# Patient Record
Sex: Male | Born: 1965
Health system: Southern US, Community
[De-identification: ages and names within clinical notes are randomized; demographics above are authoritative.]

## PROBLEM LIST (undated history)

## (undated) DIAGNOSIS — M549 Dorsalgia, unspecified: Secondary | ICD-10-CM

## (undated) DIAGNOSIS — G8929 Other chronic pain: Secondary | ICD-10-CM

## (undated) DIAGNOSIS — M5030 Other cervical disc degeneration, unspecified cervical region: Secondary | ICD-10-CM

## (undated) DIAGNOSIS — IMO0002 Reserved for concepts with insufficient information to code with codable children: Secondary | ICD-10-CM

## (undated) DIAGNOSIS — I10 Essential (primary) hypertension: Secondary | ICD-10-CM

## (undated) DIAGNOSIS — R42 Dizziness and giddiness: Secondary | ICD-10-CM

## (undated) DIAGNOSIS — I951 Orthostatic hypotension: Secondary | ICD-10-CM

## (undated) DIAGNOSIS — G253 Myoclonus: Secondary | ICD-10-CM

## (undated) DIAGNOSIS — J302 Other seasonal allergic rhinitis: Secondary | ICD-10-CM

## (undated) DIAGNOSIS — R251 Tremor, unspecified: Secondary | ICD-10-CM

## (undated) DIAGNOSIS — R4189 Other symptoms and signs involving cognitive functions and awareness: Secondary | ICD-10-CM

## (undated) DIAGNOSIS — R5383 Other fatigue: Secondary | ICD-10-CM

## (undated) DIAGNOSIS — R634 Abnormal weight loss: Secondary | ICD-10-CM

## (undated) HISTORY — DX: Abnormal weight loss: R63.4

## (undated) HISTORY — DX: Other seasonal allergic rhinitis: J30.2

## (undated) HISTORY — DX: Other fatigue: R53.83

## (undated) HISTORY — DX: Dizziness and giddiness: R42

## (undated) HISTORY — DX: Tremor, unspecified: R25.1

## (undated) HISTORY — DX: Reserved for concepts with insufficient information to code with codable children: IMO0002

---

## 1999-10-31 ENCOUNTER — Other Ambulatory Visit: Admission: RE | Admit: 1999-10-31 | Discharge: 1999-10-31 | Payer: Self-pay | Admitting: Urology

## 2010-09-07 HISTORY — PX: BACK SURGERY: SHX140

## 2011-01-28 ENCOUNTER — Ambulatory Visit (HOSPITAL_COMMUNITY)
Admission: RE | Admit: 2011-01-28 | Discharge: 2011-01-28 | Disposition: A | Discharge status: Home / Self Care | Payer: 59 | Source: Ambulatory Visit | Attending: Neurological Surgery | Admitting: Neurological Surgery

## 2011-01-28 ENCOUNTER — Encounter (HOSPITAL_COMMUNITY)
Admission: RE | Admit: 2011-01-28 | Discharge: 2011-01-28 | Disposition: A | Discharge status: Home / Self Care | Payer: 59 | Source: Ambulatory Visit | Attending: Neurological Surgery | Admitting: Neurological Surgery

## 2011-01-28 ENCOUNTER — Other Ambulatory Visit (HOSPITAL_COMMUNITY): Payer: Self-pay | Admitting: Neurological Surgery

## 2011-01-28 DIAGNOSIS — Z01818 Encounter for other preprocedural examination: Secondary | ICD-10-CM | POA: Insufficient documentation

## 2011-01-28 DIAGNOSIS — M545 Low back pain, unspecified: Secondary | ICD-10-CM | POA: Insufficient documentation

## 2011-01-28 DIAGNOSIS — Z01812 Encounter for preprocedural laboratory examination: Secondary | ICD-10-CM | POA: Insufficient documentation

## 2011-01-28 LAB — DIFFERENTIAL
Basophils Relative: 1 % (ref 0–1)
Eosinophils Absolute: 0.1 10*3/uL (ref 0.0–0.7)
Monocytes Absolute: 0.8 10*3/uL (ref 0.1–1.0)
Monocytes Relative: 10 % (ref 3–12)
Neutrophils Relative %: 69 % (ref 43–77)

## 2011-01-28 LAB — CBC
MCH: 31.5 pg (ref 26.0–34.0)
MCHC: 34.3 g/dL (ref 30.0–36.0)
Platelets: 278 10*3/uL (ref 150–400)
RBC: 4.95 MIL/uL (ref 4.22–5.81)

## 2011-01-28 LAB — BASIC METABOLIC PANEL
BUN: 20 mg/dL (ref 6–23)
CO2: 30 mEq/L (ref 19–32)
Calcium: 9.9 mg/dL (ref 8.4–10.5)
Creatinine, Ser: 1.1 mg/dL (ref 0.4–1.5)
Glucose, Bld: 123 mg/dL — ABNORMAL HIGH (ref 70–99)

## 2011-01-28 LAB — PROTIME-INR: Prothrombin Time: 12.9 seconds (ref 11.6–15.2)

## 2011-02-06 ENCOUNTER — Ambulatory Visit (HOSPITAL_COMMUNITY)
Admission: RE | Admit: 2011-02-06 | Discharge: 2011-02-06 | Disposition: A | Discharge status: Home / Self Care | Payer: 59 | Source: Ambulatory Visit | Attending: Neurological Surgery | Admitting: Neurological Surgery

## 2011-02-06 DIAGNOSIS — M5126 Other intervertebral disc displacement, lumbar region: Secondary | ICD-10-CM | POA: Insufficient documentation

## 2011-02-06 DIAGNOSIS — M79609 Pain in unspecified limb: Secondary | ICD-10-CM | POA: Insufficient documentation

## 2011-02-06 DIAGNOSIS — Z5309 Procedure and treatment not carried out because of other contraindication: Secondary | ICD-10-CM | POA: Insufficient documentation

## 2011-02-27 ENCOUNTER — Ambulatory Visit (HOSPITAL_COMMUNITY): Admission: RE | Admit: 2011-02-27 | Payer: 59 | Source: Ambulatory Visit | Admitting: Neurological Surgery

## 2011-03-04 ENCOUNTER — Encounter (HOSPITAL_COMMUNITY)
Admission: RE | Admit: 2011-03-04 | Discharge: 2011-03-04 | Disposition: A | Discharge status: Home / Self Care | Payer: 59 | Source: Ambulatory Visit | Attending: Neurological Surgery | Admitting: Neurological Surgery

## 2011-03-04 LAB — SURGICAL PCR SCREEN: MRSA, PCR: NEGATIVE

## 2011-03-04 LAB — CBC
MCH: 31.5 pg (ref 26.0–34.0)
MCV: 91.3 fL (ref 78.0–100.0)
Platelets: 276 10*3/uL (ref 150–400)
RBC: 4.85 MIL/uL (ref 4.22–5.81)
RDW: 13 % (ref 11.5–15.5)

## 2011-03-04 LAB — BASIC METABOLIC PANEL
CO2: 27 mEq/L (ref 19–32)
Calcium: 9.5 mg/dL (ref 8.4–10.5)
Glucose, Bld: 95 mg/dL (ref 70–99)
Sodium: 138 mEq/L (ref 135–145)

## 2011-03-04 LAB — DIFFERENTIAL
Eosinophils Absolute: 0 10*3/uL (ref 0.0–0.7)
Eosinophils Relative: 1 % (ref 0–5)
Lymphs Abs: 1.8 10*3/uL (ref 0.7–4.0)
Monocytes Relative: 8 % (ref 3–12)

## 2011-03-09 ENCOUNTER — Inpatient Hospital Stay (HOSPITAL_COMMUNITY)
Admission: RE | Admit: 2011-03-09 | Discharge: 2011-03-11 | DRG: 460 | Disposition: A | Discharge status: Home / Self Care | Payer: 59 | Source: Ambulatory Visit | Attending: Neurological Surgery | Admitting: Neurological Surgery

## 2011-03-09 ENCOUNTER — Inpatient Hospital Stay (HOSPITAL_COMMUNITY): Payer: 59

## 2011-03-09 DIAGNOSIS — M5126 Other intervertebral disc displacement, lumbar region: Principal | ICD-10-CM | POA: Diagnosis present

## 2011-03-09 DIAGNOSIS — Z01812 Encounter for preprocedural laboratory examination: Secondary | ICD-10-CM

## 2011-03-13 ENCOUNTER — Ambulatory Visit
Admission: RE | Admit: 2011-03-13 | Discharge: 2011-03-13 | Disposition: A | Discharge status: Home / Self Care | Payer: 59 | Source: Ambulatory Visit | Attending: Neurological Surgery | Admitting: Neurological Surgery

## 2011-03-13 ENCOUNTER — Other Ambulatory Visit: Payer: Self-pay | Admitting: Neurological Surgery

## 2011-03-13 DIAGNOSIS — M5126 Other intervertebral disc displacement, lumbar region: Secondary | ICD-10-CM

## 2011-03-13 DIAGNOSIS — R509 Fever, unspecified: Secondary | ICD-10-CM

## 2011-04-03 NOTE — Op Note (Signed)
NAMECHAO, BLAZEJEWSKI               ACCOUNT NO.:  [id number]  MEDICAL RECORD NO.:  [id number]  LOCATION:  3528                         FACILITY:  [facility]  PHYSICIAN:  Tia Alert, MD     DATE OF BIRTH:  May 18, 1966  DATE OF PROCEDURE:  03/09/2011 DATE OF DISCHARGE:                              OPERATIVE REPORT   PREOPERATIVE DIAGNOSIS:  Lumbar disk herniation, L5-S1, with severe spinal stenosis, back and leg pain.  POSTOPERATIVE DIAGNOSIS:  Lumbar disk herniation, L5-S1, with severe spinal stenosis, back and leg pain.  PROCEDURE: 1. Decompressive lumbar laminectomy, hemifacetectomy and     foraminotomies at L5-S1 to decompress the L5 and S1 nerve roots,     requiring more work to decompress the nerves then is required of a     simple plus procedure. 2. Posterior lumbar interbody fusion, L5-S1, utilizing 10-mm tangent     interbody bone wedge and a 10-mm PEEK interbody cage, packed with     local autograft and Actifuse putty. 3. Intertransverse arthrodesis of L5-S1 utilizing local autograft and     Actifuse putty. 4. Nonsegmental fixation of L5-S1 utilizing DePuy spine Expedium     pedicle screw system.  SURGEON:  Tia Alert, MD.  ASSISTANT:  Reinaldo Meeker, MD.  ANESTHESIA:  General endotracheal.  COMPLICATIONS:  None apparent.  INDICATIONS FOR PROCEDURE:  Dustin Rogers is a 45 year old gentleman who presented with back and leg pain.  He had an MRI which showed a transitional level at L5-S1 with a large disk herniation with severe canal stenosis from a very large free fragment, recommended a posterior lumbar interbody fusion at L5-S1.  He understood the risks, benefits, and expected outcome and wished to proceed.  DESCRIPTION OF PROCEDURE:  The patient was taken to the operating room. After induction of adequate generalized endotracheal anesthesia, he was rolled into prone position on chest rolls and all pressure points were padded.  His lumbar region was prepped  with DuraPrep and then draped in the usual sterile fashion.  A 10 mL of local anesthesia was injected and a dorsal midline incision made and carried down to the lumbosacral fascia.  The fascia was opened and the paraspinous musculature was taken down in a subperiosteal fashion to expose L5-S1.  Intraoperative fluoroscopy confirmed my level and then used the Leksell rongeur to remove the spinous process.  I used the Kerrison punch to perform a hemilaminectomy, hemifacetectomy, foraminotomy at L5-S1.  The L5-S1 nerve roots were identified and decompressed distally into their respective foramina.  The dura was very taut and tense because of the underlying large free fragment.  I saw it peaking out from under the S1 nerve root on the right.  I was able to remove a large fragment.  I was unable to retract the dura medially and found two more very large free fragments that were removed.  Once this was done, the nerve roots relaxed and were less tense.  The dura was less tense.  I was then able to finish my decompression of L5-S1 nerve roots distally into their foramina.  I was then able to incise the disk space bilaterally and performed the diskectomy with pituitary rongeurs.  We then used the tangent interbody instruments with rotating cutters and cutting chisels to prepare the endplates for arthrodesis.  We used 10-mm tangent interbody bone wedge on the patient's right and a 10-mm PEEK interbody cage on the patient's left.  The midline was packed with local autograft and Actifuse putty.  Once our PLIF was complete, we turned our attention of the nonsegmental fixation.  We localized the pedicle screw entry zones at L5-S1 utilizing surface landmarks and lateral and AP fluoroscopy.  We probed each pedicle with pedicle probe, tapped each pedicle with 5.0 tap, placed 6.0 x 45-mm pedicle screws at each pedicle. I decorticated the transverse processes and placed a mixture of local autograft and  Actifuse putty out over these to perform intertransverse arthrodesis.  I then placed lordotic rods into multiaxial screw heads of the pedicle screws and locked these into position with a locking caps and anti-torque device while achieving compression on the grafts.  Then irrigated with saline solution containing bacitracin.  I dried all bleeding points with bipolar cautery, aligned the dura with Gelfoam after palpating along the nerve roots with coronary dilator to assure adequate decompression of the nerve roots.  I checked my final construct with AP and lateral fluoroscopy.  I placed a medium Hemovac drain through a separate stab incision.  I then closed the muscle and fascia with 0 Vicryl, closed the subcutaneous and subcuticular tissue with 2-0 and 3-0 Vicryl, closed the skin with Benzoin and Steri-Strips.  The drapes were removed.  Sterile dressing was applied.  The patient awakened from general anesthesia and transferred to recovery room in stable condition.  At the end of the procedure, all sponge, needle, and instrument counts were correct.     Tia Alert, MD     DSJ/MEDQ  D:  03/09/2011  T:  03/10/2011  Job:  [id number]  Electronically Signed by [name] MD on 04/03/2011 11:35:50 AM

## 2011-04-07 NOTE — Discharge Summary (Signed)
  NAMEPACE, LAMADRID               ACCOUNT NO.:  [id number]  MEDICAL RECORD NO.:  [id number]  LOCATION:  3528                         FACILITY:  [facility]  PHYSICIAN:  Tia Alert, MD     DATE OF BIRTH:  1966-02-20  DATE OF ADMISSION:  03/09/2011 DATE OF DISCHARGE:  03/11/2011                              DISCHARGE SUMMARY   ADMISSION DIAGNOSIS:  Lumbar disk herniation with severe spinal stenosis.  PROCEDURES:  Posterior lumbar interbody fusion.  BRIEF HISTORY OF PRESENT ILLNESS:  [name] is a very pleasant 45 year old gentleman who presented with severe back and leg pain.  He had an MRI which showed a transitional segment of L5-S1 with large disk herniation with severe canal stenosis with large free fragment.  I recommended posterior lumbar interbody fusion at L5-S1.  He understood the risks, benefits, and expected outcome and wished to proceed.  HOSPITAL COURSE:  The patient was admitted on March 09, 2011, and taken to the operating room where underwent a posterior lumbar interbody fusion at L5-S1.  The patient tolerated the procedure well and was taken to the recovery room and then to the floor in stable condition for details of the operative procedure.  Please see the dictated operative note.  The patient's hospital course was routine.  There were complications.  He did very well following this surgery.  He had resolution of his leg pain.  He had a little residual numbness in his leg.  His back pain was fairly well controlled.  He remained on a PCA the first evening.  This was removed the next day.  He was ambulating without difficulty.  He remained afebrile with stable vital signs.  He was tolerating a regular diet.  He was discharged to home in stable condition on March 11, 2011, with plans to follow up in 2 weeks.  FINAL DIAGNOSIS:  Posterior lumbar interbody fusion at L5-S1.     Tia Alert, MD     DSJ/MEDQ  D:  04/06/2011  T:  04/06/2011  Job:   [id number]  Electronically Signed by [name] MD on 04/07/2011 03:17:59 PM

## 2011-04-20 ENCOUNTER — Other Ambulatory Visit: Payer: Self-pay | Admitting: Neurological Surgery

## 2011-04-20 ENCOUNTER — Ambulatory Visit
Admission: RE | Admit: 2011-04-20 | Discharge: 2011-04-20 | Disposition: A | Discharge status: Home / Self Care | Payer: 59 | Source: Ambulatory Visit | Attending: Neurological Surgery | Admitting: Neurological Surgery

## 2011-04-20 DIAGNOSIS — M545 Low back pain: Secondary | ICD-10-CM

## 2011-04-20 DIAGNOSIS — M79609 Pain in unspecified limb: Secondary | ICD-10-CM

## 2011-05-18 ENCOUNTER — Other Ambulatory Visit: Payer: Self-pay | Admitting: Occupational Medicine

## 2011-05-18 ENCOUNTER — Ambulatory Visit: Payer: Self-pay

## 2011-05-18 DIAGNOSIS — Z021 Encounter for pre-employment examination: Secondary | ICD-10-CM

## 2011-07-20 ENCOUNTER — Ambulatory Visit
Admission: RE | Admit: 2011-07-20 | Discharge: 2011-07-20 | Disposition: A | Discharge status: Home / Self Care | Payer: BC Managed Care – PPO | Source: Ambulatory Visit | Attending: Neurological Surgery | Admitting: Neurological Surgery

## 2011-07-20 ENCOUNTER — Other Ambulatory Visit: Payer: Self-pay | Admitting: Neurological Surgery

## 2011-07-20 DIAGNOSIS — M5126 Other intervertebral disc displacement, lumbar region: Secondary | ICD-10-CM

## 2011-07-20 DIAGNOSIS — M545 Low back pain: Secondary | ICD-10-CM

## 2011-10-27 ENCOUNTER — Other Ambulatory Visit: Payer: Self-pay | Admitting: Neurological Surgery

## 2011-10-27 ENCOUNTER — Ambulatory Visit
Admission: RE | Admit: 2011-10-27 | Discharge: 2011-10-27 | Disposition: A | Discharge status: Home / Self Care | Payer: BC Managed Care – PPO | Source: Ambulatory Visit | Attending: Neurological Surgery | Admitting: Neurological Surgery

## 2011-10-27 DIAGNOSIS — M5126 Other intervertebral disc displacement, lumbar region: Secondary | ICD-10-CM

## 2011-10-27 DIAGNOSIS — M545 Low back pain: Secondary | ICD-10-CM

## 2012-07-17 IMAGING — RF DG LUMBAR SPINE 2-3V
1 series · 2 of 2 positions shown · non-contrast
Comparison: None

CLINICAL DATA: L4-5 PLIF

LUMBAR SPINE - 2-3 VIEW

[Series 1: run · 2 of 2 slices shown]
[im 1/2]
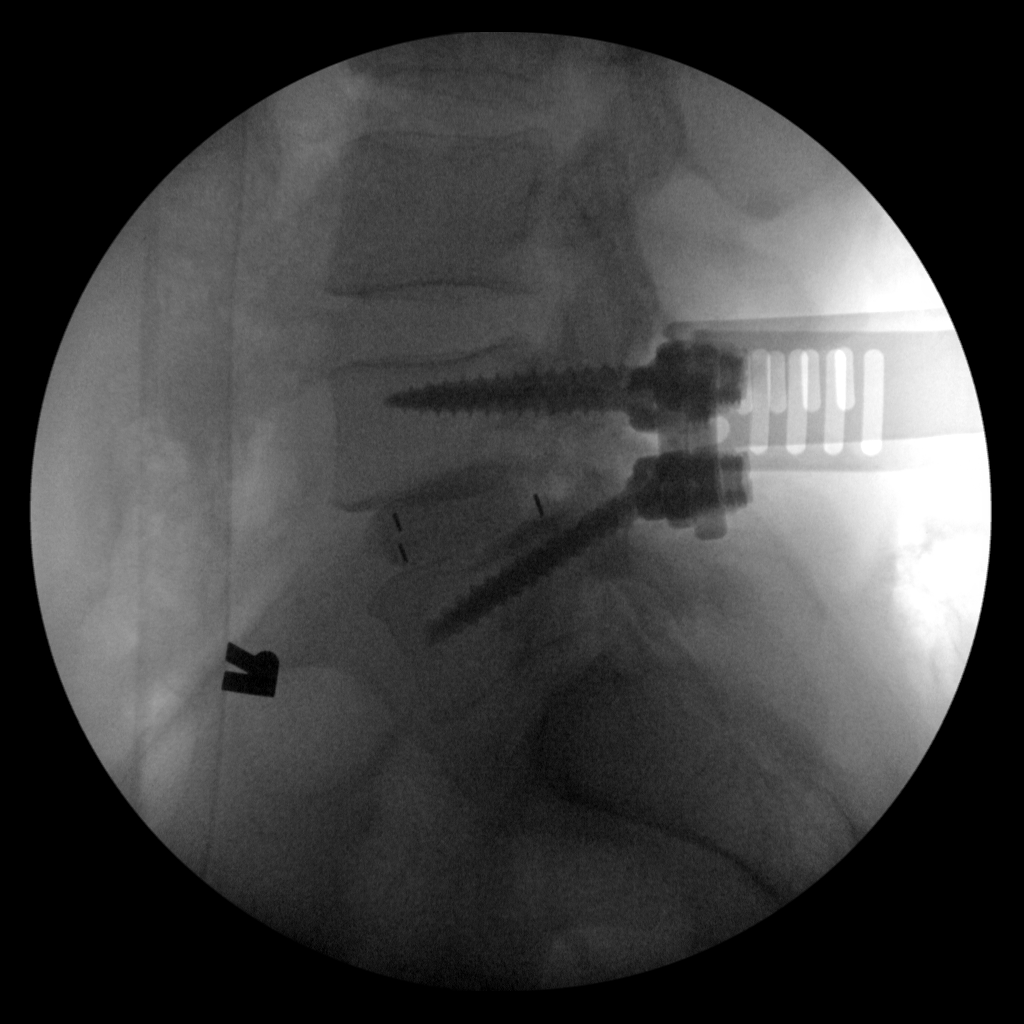
[im 2/2]
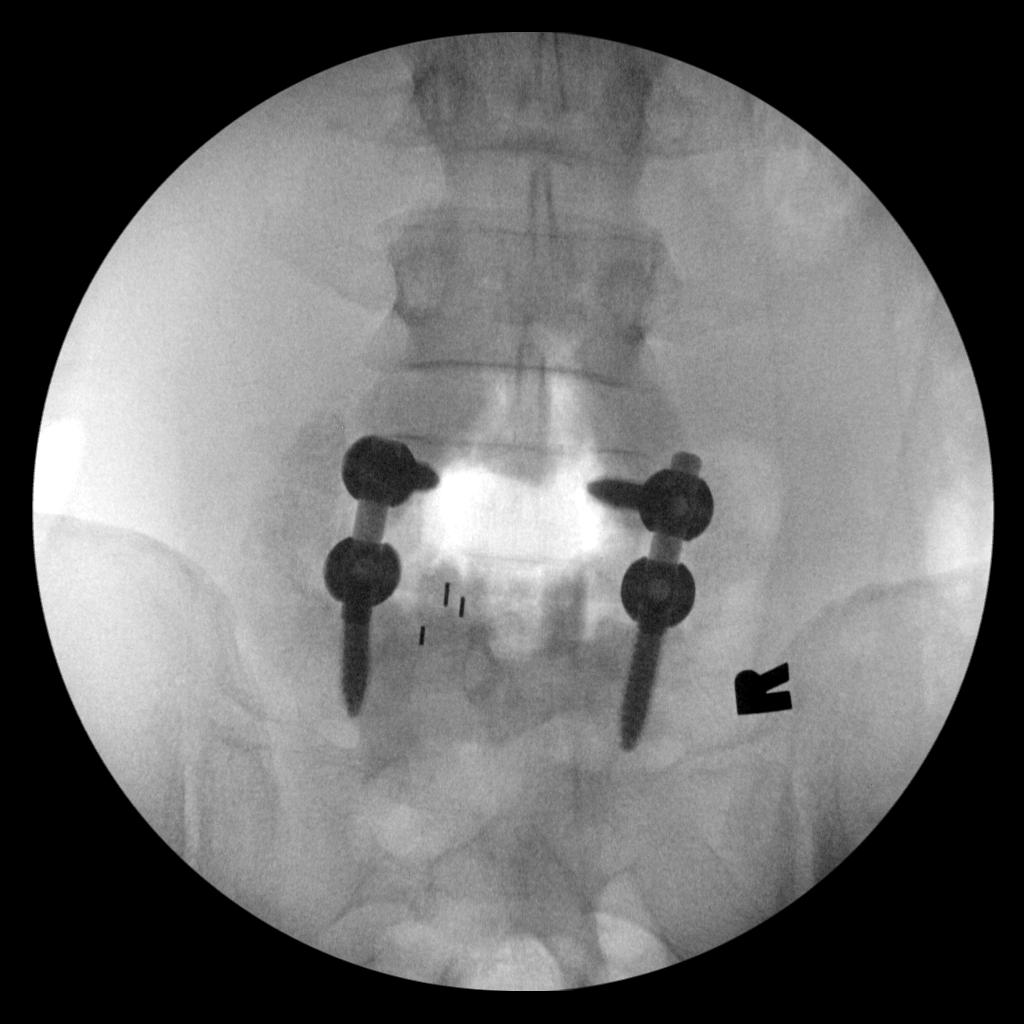

[2 of 2 positions shown; findings below may reference images not displayed]

FINDINGS: Two C-arm images show posterior decompression at L4-5
with discectomy and interbody fusion material, bilateral pedicle
screws and posterior rods.  Components appear well positioned.  No
radiographically detectable complication.
IMPRESSION: Decompression and fusion L4-5.

## 2012-11-27 IMAGING — CR DG LUMBAR SPINE 2-3V
3 series · 3 of 3 positions shown · non-contrast
Comparison: 04/20/2011.

CLINICAL DATA: Bilateral foot numbness.  Lumbar surgery.

LUMBAR SPINE - 2-3 VIEW

[t l-spine a.p.]
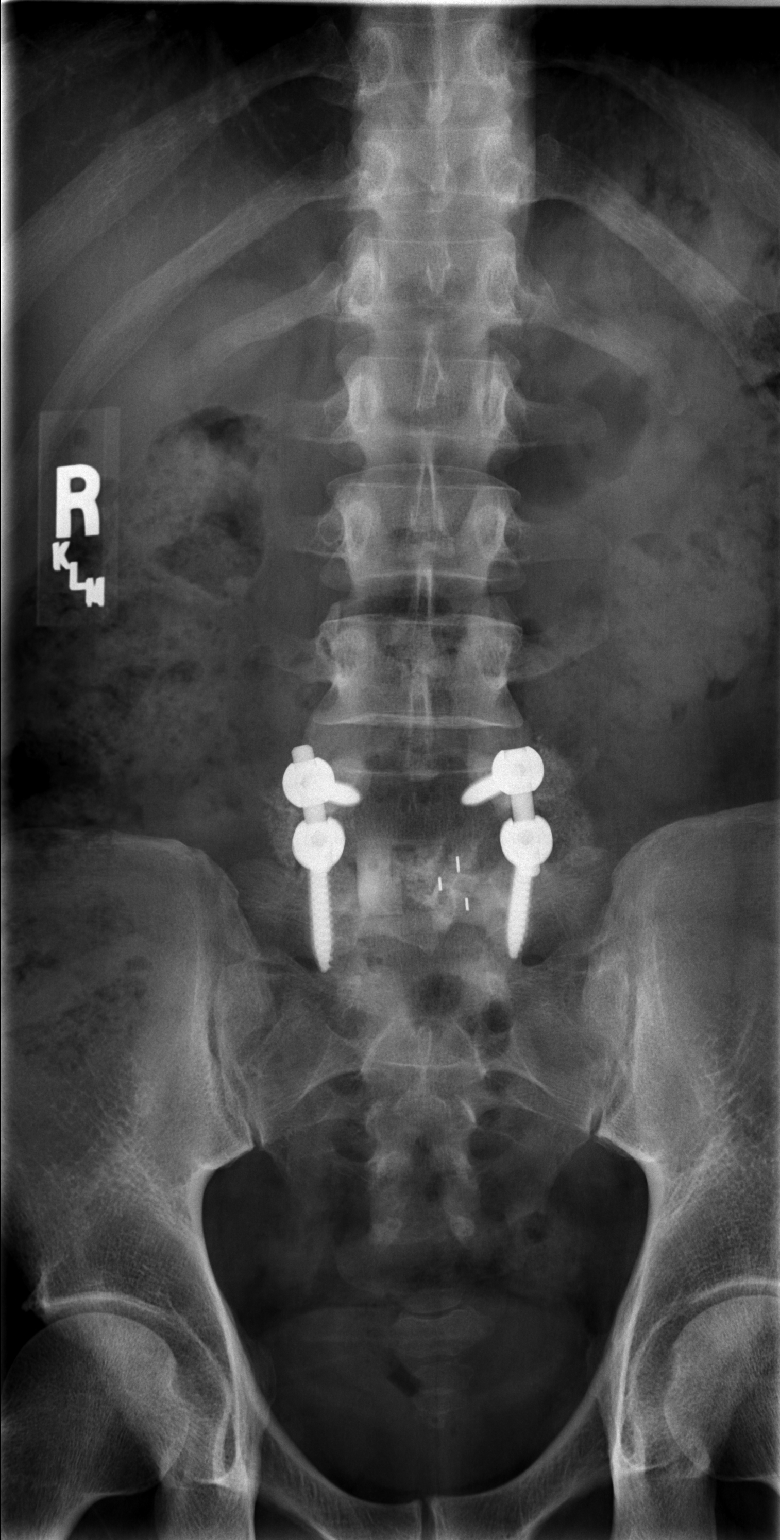

[t l-spine lat]
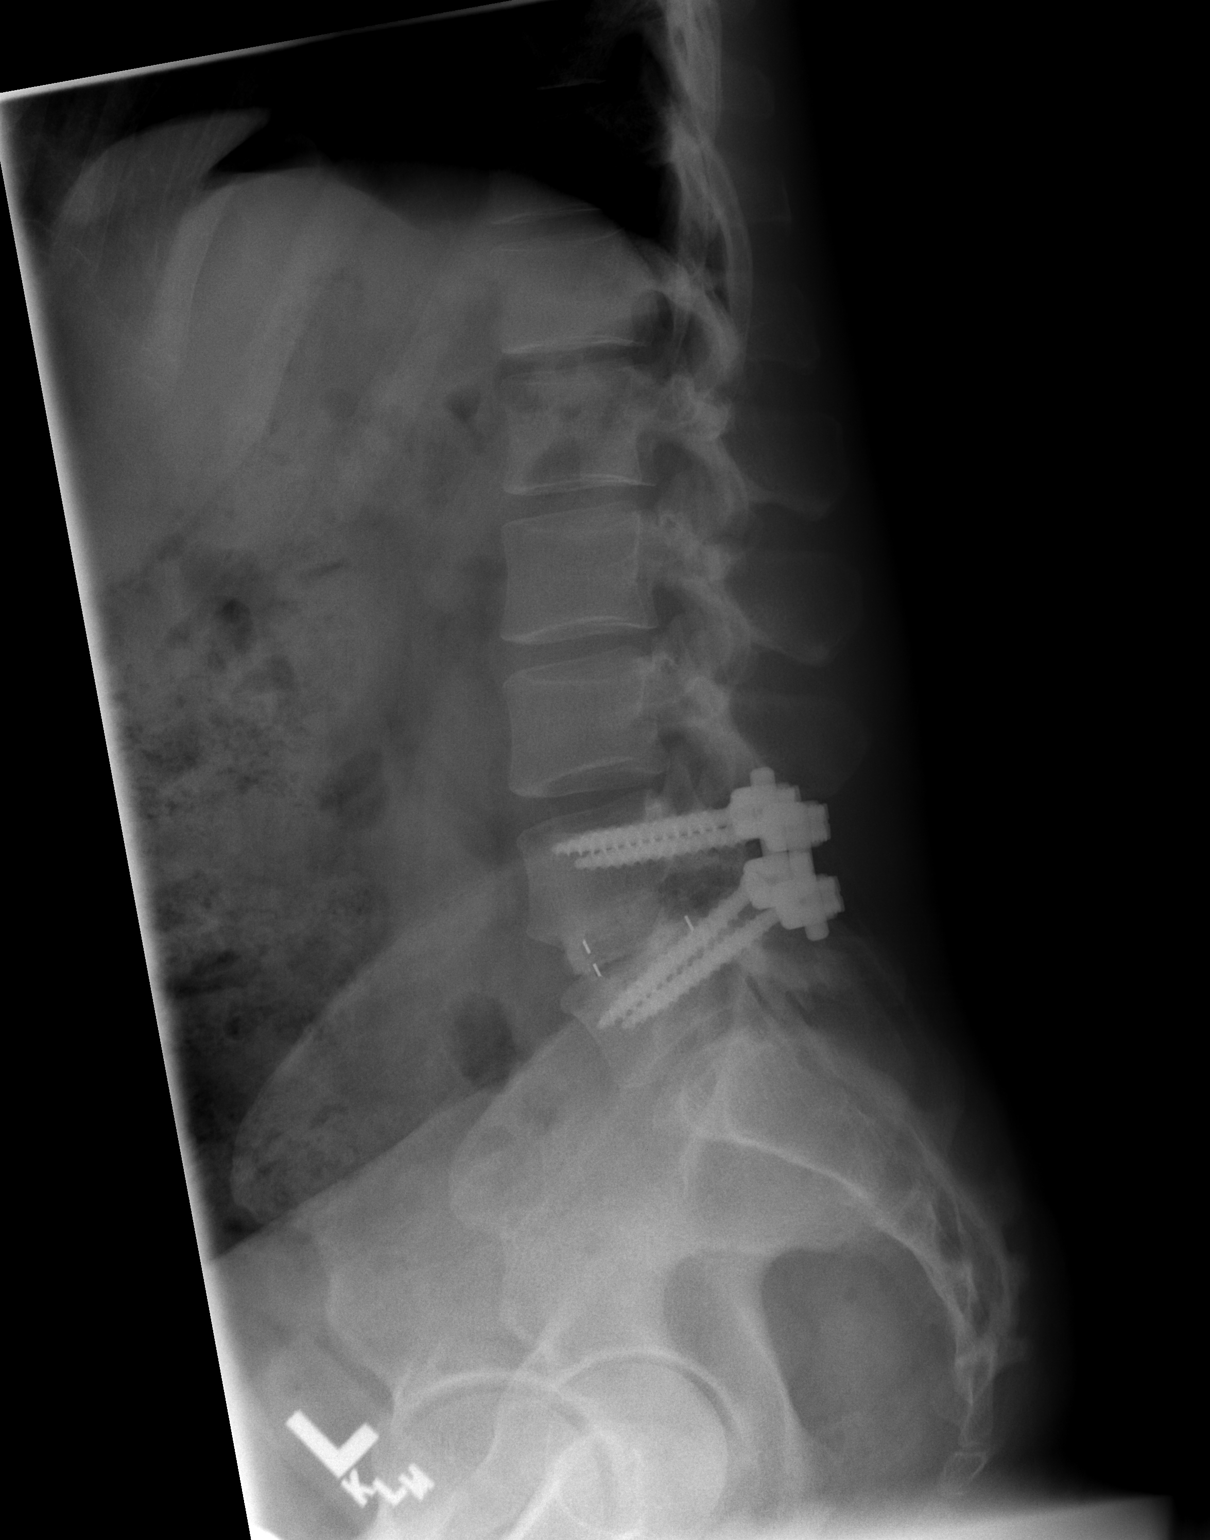

[t l-spine l5-s1 spot]
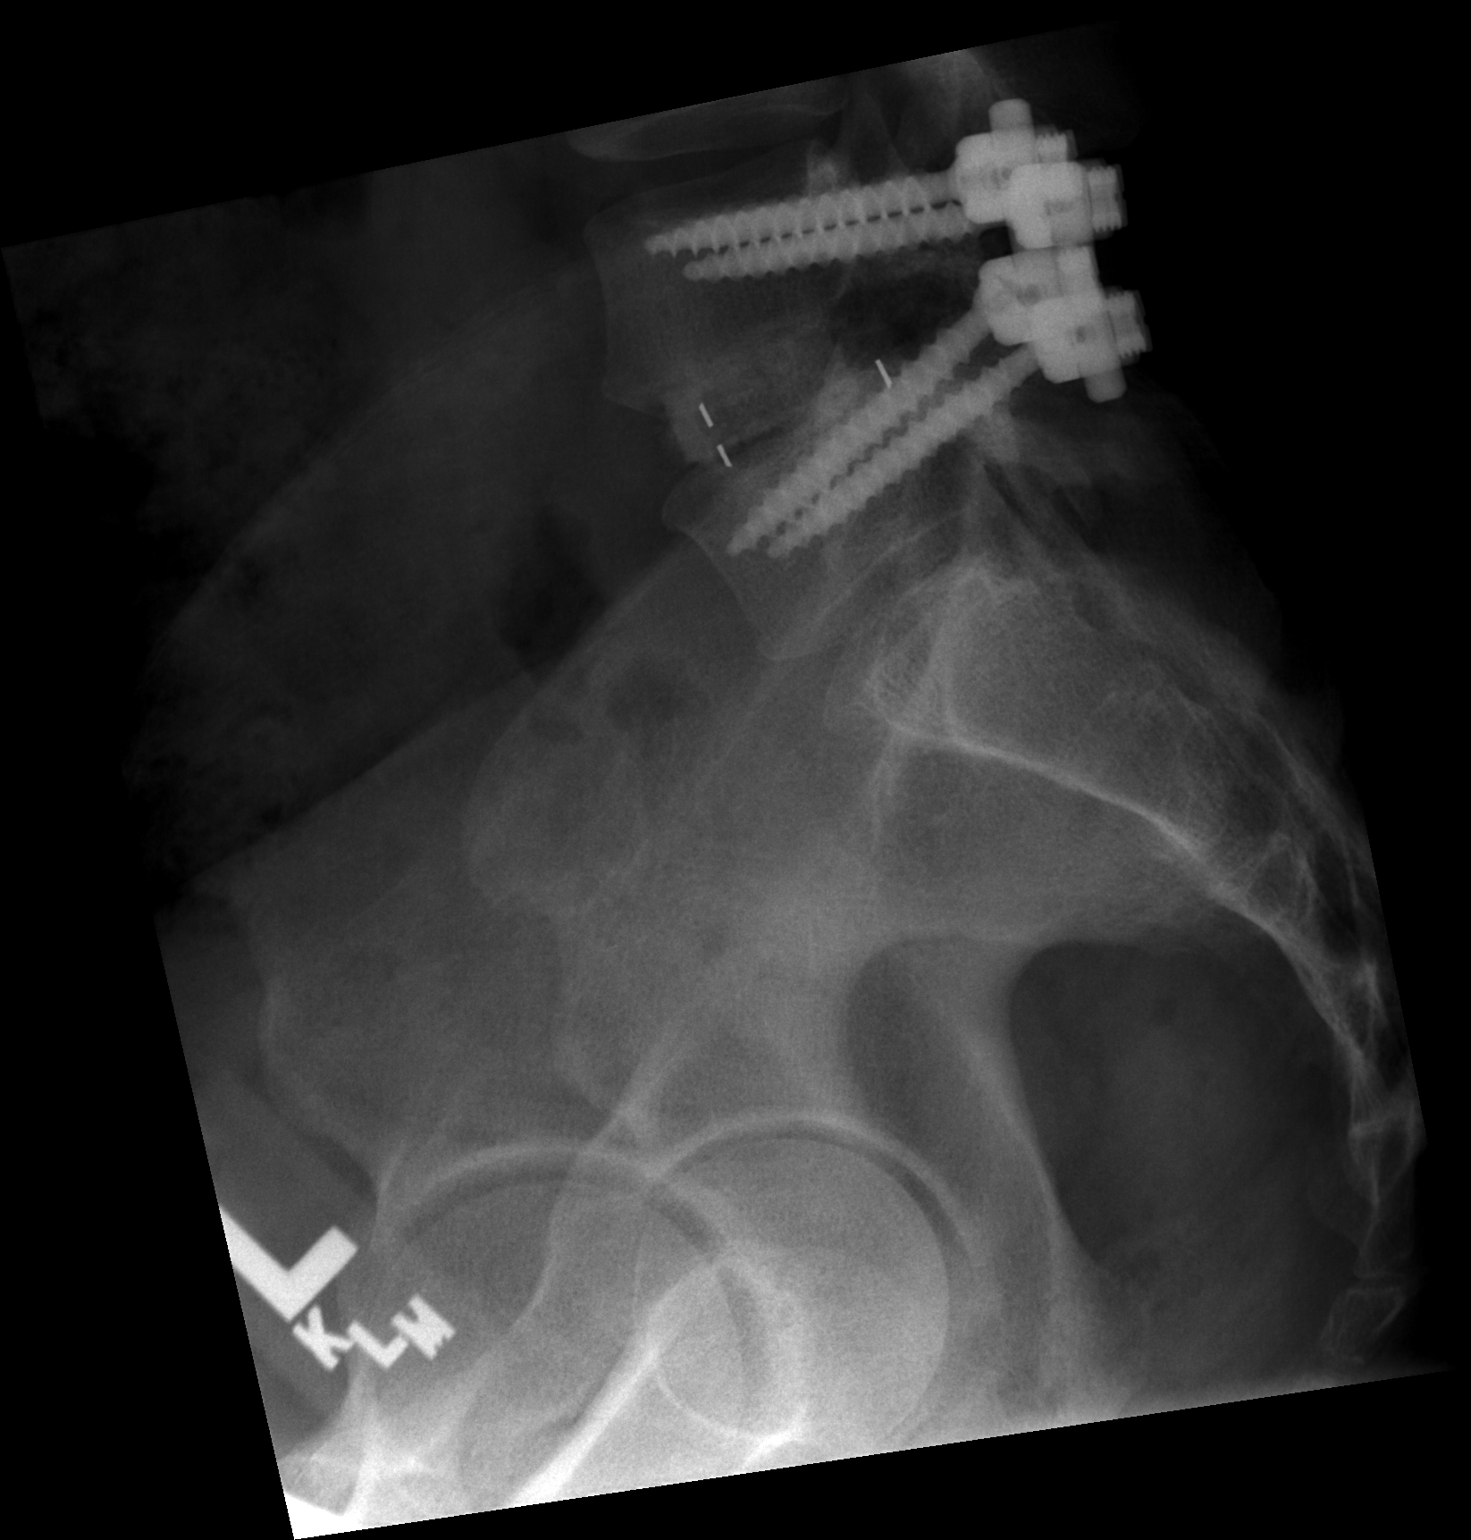

[3 of 3 positions shown; findings below may reference images not displayed]

FINDINGS: The patient is status post L4-5 posterior lumbar
interbody fusion.  Appearance is unchanged from 04/20/2011.  No
hardware complications.  Alignment is anatomic.  Vertebral body
height is maintained.  No significant degenerative changes above or
below the level of the fusion.
IMPRESSION: L4-5 PLIF without complicating feature.

## 2013-05-03 ENCOUNTER — Emergency Department (HOSPITAL_COMMUNITY): Payer: BC Managed Care – PPO

## 2013-05-03 ENCOUNTER — Emergency Department (HOSPITAL_COMMUNITY)
Admission: EM | Admit: 2013-05-03 | Discharge: 2013-05-03 | Disposition: A | Discharge status: Home / Self Care | Payer: BC Managed Care – PPO | Attending: Emergency Medicine | Admitting: Emergency Medicine

## 2013-05-03 ENCOUNTER — Encounter (HOSPITAL_COMMUNITY): Payer: Self-pay | Admitting: *Deleted

## 2013-05-03 DIAGNOSIS — R002 Palpitations: Secondary | ICD-10-CM | POA: Insufficient documentation

## 2013-05-03 DIAGNOSIS — I10 Essential (primary) hypertension: Secondary | ICD-10-CM | POA: Insufficient documentation

## 2013-05-03 DIAGNOSIS — Z79899 Other long term (current) drug therapy: Secondary | ICD-10-CM | POA: Insufficient documentation

## 2013-05-03 HISTORY — DX: Essential (primary) hypertension: I10

## 2013-05-03 LAB — CBC
HCT: 40.9 % (ref 39.0–52.0)
MCV: 87.2 fL (ref 78.0–100.0)
RBC: 4.69 MIL/uL (ref 4.22–5.81)
WBC: 8.3 10*3/uL (ref 4.0–10.5)

## 2013-05-03 LAB — D-DIMER, QUANTITATIVE: D-Dimer, Quant: <0.27 ug/mL-FEU (ref 0.00–0.48)

## 2013-05-03 LAB — BASIC METABOLIC PANEL
BUN: 14 mg/dL (ref 6–23)
CO2: 24 mEq/L (ref 19–32)
Chloride: 103 mEq/L (ref 96–112)
Creatinine, Ser: 1.07 mg/dL (ref 0.50–1.35)

## 2013-05-03 NOTE — ED Notes (Signed)
Spoke with [name], in laboratory she reports she has a blue top and will add on the D-dimer.

## 2013-05-03 NOTE — ED Provider Notes (Signed)
Medical screening examination/treatment/procedure(s) were performed by non-physician practitioner and as supervising physician I was immediately available for consultation/collaboration.   Junius Argyle, MD 05/03/13 1950

## 2013-05-03 NOTE — ED Provider Notes (Signed)
CSN: [id number]     Arrival date & time 05/03/13  1228 History   First MD Initiated Contact with Patient 05/03/13 1249     Chief Complaint  Patient presents with  . Shortness of Breath  . Palpitations   (Consider location/radiation/quality/duration/timing/severity/associated sxs/prior Treatment) HPI Comments: Patient is a 47 y/o male with a hx of HTN who presents for intermittent palpitations x 1 week. Patient states that palpitations occur with exertion or at rest and he describes them as his "heart pounding". He denies a rapid heart rate with symptoms. Patient denies any modifying factors of his symptoms. He admits to associated shortness of breath with palpitations and denies associated fever, jaw pain, diaphoresis, numbness/tingling, extremity weakness, N/V, abdominal pain, lightheadedness/dizziness, and syncope. Patient denies hx of ACS, CAD, HLD, DM and PE/DVT. Denies recent long plane rides/car rides, HRT, and recent surgeries or immunizations. Denies FHx of ACS, CAD, and stroke. Patient is a never smoker.  Patient is a 47 y.o. male presenting with shortness of breath and palpitations. The history is provided by the patient. No language interpreter was used.  Shortness of Breath Associated symptoms: no chest pain, no diaphoresis, no fever and no vomiting   Palpitations Associated symptoms: shortness of breath   Associated symptoms: no chest pain, no diaphoresis, no nausea, no numbness and no vomiting     Past Medical History  Diagnosis Date  . Hypertension    History reviewed. No pertinent past surgical history. History reviewed. No pertinent family history. History  Substance Use Topics  . Smoking status: Never Smoker   . Smokeless tobacco: Not on file  . Alcohol Use: Not on file    Review of Systems  Constitutional: Negative for fever and diaphoresis.  Eyes: Negative for visual disturbance.  Respiratory: Positive for shortness of breath.   Cardiovascular: Positive for  palpitations. Negative for chest pain.  Gastrointestinal: Negative for nausea and vomiting.  Neurological: Negative for weakness and numbness.  All other systems reviewed and are negative.   Allergies  Review of patient's allergies indicates no known allergies.  Home Medications   Current Outpatient Rx  Name  Route  Sig  Dispense  Refill  . olmesartan (BENICAR) 20 MG tablet   Oral   Take 20 mg by mouth daily.         . tadalafil (CIALIS) 20 MG tablet   Oral   Take 20 mg by mouth daily as needed for erectile dysfunction.         . Vitamin D, Ergocalciferol, (DRISDOL) 50000 UNITS CAPS capsule   Oral   Take 50,000 Units by mouth every Monday.          BP 133/78  Pulse 57  Temp(Src) 98.2 F (36.8 C) (Oral)  Resp 14  Ht 5\' 9"  (1.753 m)  Wt 167 lb (75.751 kg)  BMI 24.65 kg/m2  SpO2 99%  Physical Exam  Nursing note and vitals reviewed. Constitutional: He is oriented to person, place, and time. He appears well-developed and well-nourished. No distress.  HENT:  Head: Normocephalic and atraumatic.  Mouth/Throat: Oropharynx is clear and moist. No oropharyngeal exudate.  Eyes: Conjunctivae and EOM are normal. Pupils are equal, round, and reactive to light. No scleral icterus.  Neck: Normal range of motion. Neck supple.  Cardiovascular: Normal rate, regular rhythm, normal heart sounds and intact distal pulses.   Pulmonary/Chest: Effort normal and breath sounds normal. No respiratory distress. He has no wheezes. He has no rales. He exhibits no tenderness.  Abdominal: Soft.  He exhibits no distension. There is no tenderness.  Musculoskeletal: Normal range of motion.  Neurological: He is alert and oriented to person, place, and time.  Skin: Skin is warm and dry. No rash noted. He is not diaphoretic. No erythema. No pallor.  Psychiatric: He has a normal mood and affect. His behavior is normal.    ED Course  Procedures (including critical care time) Labs Review Labs  Reviewed  BASIC METABOLIC PANEL - Abnormal; Notable for the following:    GFR calc non Af Amer 82 (*)    All other components within normal limits  CBC  PRO B NATRIURETIC PEPTIDE  D-DIMER, QUANTITATIVE  POCT I-STAT TROPONIN I   Imaging Review Dg Chest 2 View  05/03/2013   *RADIOLOGY REPORT*  Clinical Data:  Shortness of breath and hypertension.  CHEST - 2 VIEW  Comparison: 05/18/2011  Findings: The heart size and mediastinal contours are within normal limits.  Both lungs are clear.  The visualized skeletal structures are unremarkable.  IMPRESSION: No active disease.   Original Report Authenticated By: Irish Lack, M.D.    Date: 05/03/2013  Rate: 70  Rhythm: normal sinus rhythm  QRS Axis: normal  Intervals: normal  ST/T Wave abnormalities: normal  Conduction Disutrbances:none  Narrative Interpretation: NSR; no STEMI or ischemic changes  Old EKG Reviewed: unchanged from 01/28/2011 I have personally reviewed and interpreted this EKG  MDM   1. Palpitations    47 y/o male with hx of HTN who presents for palpitations. Patient asymptomatic throughout ED course. He is well and nontoxic appearing, hemodynamically stable, and afebrile. Physical exam findings unremarkable. EKG unchanged from prior, without STEMI or ischemic changes. Chest x-ray negative for pneumonia, pneumothorax, pleural effusion, or other acute cardiopulmonary abnormality. Labs today unremarkable. Troponin 0.00, BNP wnl, and D dimer not elevated effectively ruling out pulmonary embolism. Patient endorses increased stress and anxiety lately. Believes that increased stress is responsible for patient's symptoms. Doubt ACS given atypical nature of symptoms, lack of family history, physical exam, and reassuring workup today. Patient's TIMI score 0. Patient appropriate for discharge with primary care followup. Have also referred patient to cardiology to have outpatient stress test completed. Indications for ED return provided and  patient agreeable to plan with no unaddressed concerns.    Antony Madura, PA-C 05/03/13 1526

## 2013-05-03 NOTE — ED Notes (Signed)
Pt in c/o shortness of breath and palpitations since last night, denies pain, states symptoms are constant, also c/o dizziness, denies history of same

## 2013-05-20 ENCOUNTER — Encounter: Payer: Self-pay | Admitting: Neurology

## 2013-05-20 DIAGNOSIS — J302 Other seasonal allergic rhinitis: Secondary | ICD-10-CM | POA: Insufficient documentation

## 2013-05-20 DIAGNOSIS — R42 Dizziness and giddiness: Secondary | ICD-10-CM | POA: Insufficient documentation

## 2013-05-20 DIAGNOSIS — R5383 Other fatigue: Secondary | ICD-10-CM

## 2013-05-20 DIAGNOSIS — I10 Essential (primary) hypertension: Secondary | ICD-10-CM

## 2013-05-23 ENCOUNTER — Ambulatory Visit (INDEPENDENT_AMBULATORY_CARE_PROVIDER_SITE_OTHER): Payer: BC Managed Care – PPO | Admitting: Neurology

## 2013-05-23 ENCOUNTER — Encounter: Payer: Self-pay | Admitting: Neurology

## 2013-05-23 VITALS — BP 109/75 | HR 65 | Ht 70.0 in | Wt 166.0 lb

## 2013-05-23 DIAGNOSIS — R259 Unspecified abnormal involuntary movements: Secondary | ICD-10-CM

## 2013-05-23 DIAGNOSIS — R251 Tremor, unspecified: Secondary | ICD-10-CM | POA: Insufficient documentation

## 2013-05-23 DIAGNOSIS — G609 Hereditary and idiopathic neuropathy, unspecified: Secondary | ICD-10-CM

## 2013-05-23 NOTE — Patient Instructions (Addendum)
Overall you are doing fairly well but I do want to suggest a few things today:   Remember to drink plenty of fluid, eat healthy meals and do not skip any meals. Try to eat protein with a every meal and eat a healthy snack such as fruit or nuts in between meals. Try to keep a regular sleep-wake schedule and try to exercise daily, particularly in the form of walking, 20-30 minutes a day, if you can.   As far as diagnostic testing: I would like to check some blood work and order a brain MRI  I would like to see you back in 1 to 2 months, sooner if we need to. Please call us with any interim questions, concerns, problems, updates or refill requests.   Please also call us for any test results so we can go over those with you on the phone.  My clinical assistant and will answer any of your questions and relay your messages to me and also relay most of my messages to you.   Our phone number is [phone]. We also have an after hours call service for urgent matters and there is a physician on-call for urgent questions. For any emergencies you know to call 911 or go to the nearest emergency room

## 2013-05-23 NOTE — Progress Notes (Signed)
[facility]  Provider:  Dr Hosie Poisson Referring Provider: [name], MD Primary Care Physician:  [name], MD  CC:  Fatigue, tremor, dizziness, muscle spasms  HPI:  Dustin Rogers is a 47 y.o. male here as a referral from Dr. [name] for evaluation of tremor.  He notes the tremor began around a year ago, noticed with re, cells with action. Feels that involves both hands, may have started on the left and is worse on the left. He gets really severe with anxiety and stress. No other triggering factors. He also notes brief myoclonic jerks of his shoulders left greater than right. Notes he has slowed down and is walking, wife notices this also. Notes more unsteadiness walking, some trips. Notes some crampy stiffness in his muscles, worse at night. No change in voice or handwriting. He does note some dizziness, most notably upon standing. Wife notes episode where he appear to pass out and lost consciousness. Notes being fatigued throughout the day, complete lack of energy. Has difficulty with constipation. Wakes up freely throughout the night, needing to urinate. No REM behavior disorder. No snoring, no apnea events no stridor. Appetite comes and goes, no difficulty with swallowing, has been losing weight. Has some difficulty with sexual performance, impotence. No difficulty with sense of smell. No history of dopamine blocking agents, no exposure to heavy metals, paints or lead.  Has history of lower back pain with chronic numbness on the left lateral foot. Had surgery in the past. No family history of neurological disorders. No family history of tremor. Does not drink coffee  or alcohol.  Wife notes some difficulty with short term memory. 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    Review of Systems: Out of a complete 14 system review, the patient complains of only the following symptoms, and all other reviewed systems are negative. Positive for weight loss fatigue blurred vision  shortness of breath palpitations cramps aching muscles allergies urinary problems itching decreased energy change in appetite tremor dizziness numbness sleepiness  History   Social History  . Marital Status: Married    Spouse Name: [name]    Number of Children: 3  . Years of Education: college   Occupational History  . CAD DESIGNER    Social History Main Topics  . Smoking status: Never Smoker   . Smokeless tobacco: Never Used  . Alcohol Use: 0.6 oz/week    1 Cans of beer per week     Comment: OCC  . Drug Use: No  . Sexual Activity: Not on file   Other Topics Concern  . 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 [name]   [employer].          Family History  Problem Relation Age of Onset  . High blood pressure Mother   . High blood pressure Father   . Liver cancer Paternal Grandfather     Past Medical History  Diagnosis Date  . Hypertension   . Herniated disc   . Seasonal allergies   . Dizziness   . Tremor     hands  . Weight loss   . Fatigue     Past Surgical History  Procedure Laterality Date  . Lumbar disck      2012    Current Outpatient Prescriptions  Medication Sig Dispense Refill  . Multiple Vitamins-Minerals (MULTIVITAMIN PO) Take by mouth daily.      Marland Kitchen  olmesartan (BENICAR) 20 MG tablet Take 20 mg by mouth daily.      . tadalafil (CIALIS) 20 MG tablet Take 20 mg by mouth daily as needed for erectile dysfunction.      . Vitamin D, Ergocalciferol, (DRISDOL) 50000 UNITS CAPS capsule Take 50,000 Units by mouth every Monday.       No current facility-administered medications for this visit.    Allergies as of 05/23/2013  . (No Known Allergies)    Vitals: BP 109/75  Pulse 65  Ht 5\' 10"  (1.778 m)  Wt 166 lb (75.297 kg)  BMI 23.82 kg/m2 Last Weight:  Wt Readings from Last 1 Encounters:  05/23/13 166 lb (75.297 kg)   Last Height:   Ht Readings from  Last 1 Encounters:  05/23/13 5\' 10"  (1.778 m)   Manual BP check Prone: 132/78 P 78 Standing 110/70 P 80  Physical exam: Exam: Gen: NAD, conversant Eyes: anicteric sclerae, moist conjunctivae HENT: Atraumatic Neck: Trachea midline; supple,  Lungs: CTA, no wheezing, rales, rhonic                          CV: RRR, no MRG Abdomen: Soft, non-tender;  Extremities: No peripheral edema  Skin: Normal temperature, no rash,  Psych: Appropriate affect, pleasant  Neuro: MS: AA&Ox3, appropriately interactive, normal affect   Speech: fluent w/o paraphasic error  Memory: good recent and remote recall  CN: PERRL, EOMI no nystagmus, no ptosis, sensation intact to LT V1-V3 bilat, face symmetric, no weakness, hearing grossly intact, palate elevates symmetrically, shoulder shrug 5/5 bilat,  tongue protrudes midline, no fasiculations noted.  Motor: normal bulk and tone Strength: 5/5  In all extremities  Reflexes: symmetrical, bilat downgoing toes  Sens: LT intact in all extremities, diminished LT bilat LE L>R  Brief Motor UPDRS  Speech: mild tremor/stuttering in speech  Facial Expression: decreased blink rate  Tremor: Rest R 0 L 1  Action/postural R 1(postural) L 1(postural)  Rigidity: bilat UE 1  Finger taps:   R:0 L:1  Open/close hands: R:0 L:1  Foot taps: R:0 L:0  Arising from Chair: 0  Gait/FOG: Slightly wide based, decreased arm swing on left, 3 step turn. Negative Rhomberg. Mild lack of postural response with pull test    Assessment:  After physical and neurologic examination, review of laboratory studies, imaging, neurophysiology testing and pre-existing records, assessment will be reviewed on the problem list.  Plan:  Treatment plan and additional workup will be reviewed under Problem List.  Mr Whipp is a pleasant 47 year old gentleman gentleman who presents for initial evaluation of tremor, fatigue, dizziness and muscle spasms. This has been ongoing for  around a year getting progressively worse. It is left-sided predominant, without rest tremor component. Patients physical exam was pertinent for orthostatic blood pressure changes, mild left hand rest tremor, bradykinesia on the left side and decreased arm swing on the left. Based on his clinical history and symptoms there is concern for a underlying parkinsonism. With multiple autonomic features would especially consider atypical such as MSA. This was discussed extensively with the patient. Will undergo lab workup and brain MRI. Patient to return to office once workup is complete to discuss further steps.  1)Tremor 2)Fatigue 3)Neuropathy  -lab workup -brain MRI -check MOCA/MMSE next visit -can consider PT referral next visit -pending workup would consider medication to treat autonomic dysfunction and/or tremor -follow up when workup complete

## 2013-05-25 LAB — HGB A1C W/O EAG: Hgb A1c MFr Bld: 6.4 % — ABNORMAL HIGH (ref 4.8–5.6)

## 2013-05-25 LAB — CELIAC DISEASE AB SCREEN W/RFX: Transglutaminase IgA: <2 U/mL (ref 0–3)

## 2013-05-25 LAB — VITAMIN B12: Vitamin B-12: 637 pg/mL (ref 211–946)

## 2013-05-25 LAB — METHYLMALONIC ACID, SERUM: Methylmalonic Acid: 72 nmol/L (ref 0–378)

## 2013-05-30 ENCOUNTER — Emergency Department (HOSPITAL_COMMUNITY)
Admission: EM | Admit: 2013-05-30 | Discharge: 2013-05-31 | Disposition: A | Discharge status: Home / Self Care | Payer: BC Managed Care – PPO | Attending: Emergency Medicine | Admitting: Emergency Medicine

## 2013-05-30 ENCOUNTER — Encounter (HOSPITAL_COMMUNITY): Payer: Self-pay

## 2013-05-30 DIAGNOSIS — I10 Essential (primary) hypertension: Secondary | ICD-10-CM | POA: Insufficient documentation

## 2013-05-30 DIAGNOSIS — R7309 Other abnormal glucose: Secondary | ICD-10-CM | POA: Insufficient documentation

## 2013-05-30 DIAGNOSIS — Z8669 Personal history of other diseases of the nervous system and sense organs: Secondary | ICD-10-CM | POA: Insufficient documentation

## 2013-05-30 DIAGNOSIS — N289 Disorder of kidney and ureter, unspecified: Secondary | ICD-10-CM | POA: Insufficient documentation

## 2013-05-30 DIAGNOSIS — Z79899 Other long term (current) drug therapy: Secondary | ICD-10-CM | POA: Insufficient documentation

## 2013-05-30 DIAGNOSIS — Z8739 Personal history of other diseases of the musculoskeletal system and connective tissue: Secondary | ICD-10-CM | POA: Insufficient documentation

## 2013-05-30 DIAGNOSIS — R739 Hyperglycemia, unspecified: Secondary | ICD-10-CM

## 2013-05-30 DIAGNOSIS — R002 Palpitations: Secondary | ICD-10-CM | POA: Insufficient documentation

## 2013-05-30 LAB — BASIC METABOLIC PANEL
CO2: 28 mEq/L (ref 19–32)
Calcium: 9.1 mg/dL (ref 8.4–10.5)
Chloride: 105 mEq/L (ref 96–112)
Glucose, Bld: 157 mg/dL — ABNORMAL HIGH (ref 70–99)
Sodium: 144 mEq/L (ref 135–145)

## 2013-05-30 LAB — CBC
Hemoglobin: 15 g/dL (ref 13.0–17.0)
MCH: 30.6 pg (ref 26.0–34.0)
MCV: 88 fL (ref 78.0–100.0)
RBC: 4.9 MIL/uL (ref 4.22–5.81)

## 2013-05-30 LAB — POCT I-STAT TROPONIN I

## 2013-05-30 NOTE — ED Notes (Addendum)
Pt c/o intermittent mid-sternum chest pain, dizziness, and feeling lightheaded longer than a week. Spouse reports given pt ASA 325 mg pta

## 2013-05-30 NOTE — ED Notes (Signed)
Pt presents with intermittent mid sternal chest pain for the past week, upon arrival to exam room NSR on monitor and pt denies any pain- denies shortness of breath with the chest pain.  Pt has constant tremor to left side of body- currently seeing neurologist and being "evaluated for Parkinson's"- pt is scheduled to have MRI done tomorrow.

## 2013-05-31 ENCOUNTER — Ambulatory Visit (INDEPENDENT_AMBULATORY_CARE_PROVIDER_SITE_OTHER): Payer: BC Managed Care – PPO

## 2013-05-31 ENCOUNTER — Telehealth: Payer: Self-pay | Admitting: Neurology

## 2013-05-31 DIAGNOSIS — R259 Unspecified abnormal involuntary movements: Secondary | ICD-10-CM

## 2013-05-31 DIAGNOSIS — R251 Tremor, unspecified: Secondary | ICD-10-CM

## 2013-05-31 MED ORDER — METOPROLOL TARTRATE 25 MG PO TABS: 25.0000 mg | ORAL_TABLET | Freq: Two times a day (BID) | ORAL | Status: DC

## 2013-05-31 NOTE — ED Provider Notes (Signed)
CSN: [id number]     Arrival date & time 05/30/13  2230 History   First MD Initiated Contact with Patient 05/31/13 0015     Chief Complaint  Patient presents with  . Chest Pain   (Consider location/radiation/quality/duration/timing/severity/associated sxs/prior Treatment) Patient is a 47 y.o. Rogers presenting with chest pain. The history is provided by the patient.  Chest Pain He has been having episodes of a sense of his heart pounding and a throbbing mid sternal chest pain. These have been occurring for the last 5 weeks. They will last for several hours before resolving. They can come on at rest and have awakened him from sleep. Nothing makes it better nothing makes it worse. It goes away on its own. There is no associated dyspnea, and nausea, diaphoresis. He been seen in the emergency department regarding these episodes and had been referred to cardiology and an appointment is set up for October 11. He states that episodes are not getting worse but they're not getting better either. He does feel like he gets somewhat better after he takes a dose of aspirin. Cardiac risk factors are significant only for hypertension. He is a nonsmoker without history of diabetes or hyperlipidemia and without a family history of premature coronary atherosclerosis.  Past Medical History  Diagnosis Date  . Hypertension   . Herniated disc   . Seasonal allergies   . Dizziness   . Tremor     hands  . Weight loss   . Fatigue    Past Surgical History  Procedure Laterality Date  . Lumbar disck      2012   Family History  Problem Relation Age of Onset  . High blood pressure Mother   . High blood pressure Father   . Liver cancer Paternal Grandfather    History  Substance Use Topics  . Smoking status: Never Smoker   . Smokeless tobacco: Never Used  . Alcohol Use: 0.6 oz/week    1 Cans of beer per week     Comment: OCC    Review of Systems  Cardiovascular: Positive for chest pain.  All other systems  reviewed and are negative.    Allergies  Review of patient's allergies indicates no known allergies.  Home Medications   Current Outpatient Rx  Name  Route  Sig  Dispense  Refill  . Multiple Vitamins-Minerals (MULTIVITAMIN PO)   Oral   Take 1 tablet by mouth daily.          Marland Kitchen olmesartan (BENICAR) 20 MG tablet   Oral   Take 40 mg by mouth daily.          . tadalafil (CIALIS) 20 MG tablet   Oral   Take 20 mg by mouth daily as needed for erectile dysfunction.          BP 137/79  Pulse 84  Temp(Src) 98.3 F (36.8 C) (Oral)  Resp 17  Ht 5\' 9"  (1.753 m)  Wt 165 lb (74.844 kg)  BMI 24.36 kg/m2  SpO2 99% Physical Exam  Nursing note and vitals reviewed.  47 year old Rogers, resting comfortably and in no acute distress. Vital signs are normal. Oxygen saturation is 99%, which is normal. Head is normocephalic and atraumatic. PERRLA, EOMI. Oropharynx is clear. Neck is nontender and supple without adenopathy or JVD. Back is nontender and there is no CVA tenderness. Lungs are clear without rales, wheezes, or rhonchi. Chest is nontender. Heart has regular rate and rhythm without murmur. Abdomen is soft, flat, nontender  without masses or hepatosplenomegaly and peristalsis is normoactive. Extremities have no cyanosis or edema, full range of motion is present. Skin is warm and dry without rash. Neurologic: Mental status is normal, cranial nerves are intact, there are no motor or sensory deficits.  ED Course  Procedures (including critical care time) Labs Review Results for orders placed during the hospital encounter of 05/30/13  CBC      Result Value Range   WBC 9.9  4.0 - 10.5 K/uL   RBC 4.90  4.22 - 5.81 MIL/uL   Hemoglobin 15.0  13.0 - 17.0 g/dL   HCT 52.8  41.3 - 24.4 %   MCV 88.0  78.0 - 100.0 fL   MCH 30.6  26.0 - 34.0 pg   MCHC 34.8  30.0 - 36.0 g/dL   RDW 01.0  27.2 - 53.6 %   Platelets 306  150 - 400 K/uL  BASIC METABOLIC PANEL      Result Value Range    Sodium 144  135 - 145 mEq/L   Potassium 4.2  3.5 - 5.1 mEq/L   Chloride 105  96 - 112 mEq/L   CO2 28  19 - 32 mEq/L   Glucose, Bld 157 (*) Dustin - 99 mg/dL   BUN 17  6 - 23 mg/dL   Creatinine, Ser 6.44 (*) 0.50 - 1.35 mg/dL   Calcium 9.1  8.4 - 03.4 mg/dL   GFR calc non Af Amer 58 (*) >90 mL/min   GFR calc Af Amer 68 (*) >90 mL/min  POCT I-STAT TROPONIN I      Result Value Range   Troponin i, poc 0.00  0.00 - 0.08 ng/mL   Comment 3            Dg Chest 2 View  05/03/2013   *RADIOLOGY REPORT*  Clinical Data:  Shortness of breath and hypertension.  CHEST - 2 VIEW  Comparison: 05/18/2011  Findings: The heart size and mediastinal contours are within normal limits.  Both lungs are clear.  The visualized skeletal structures are unremarkable.  IMPRESSION: No active disease.   Original Report Authenticated By: Irish Lack, M.D.   ECG shows normal sinus rhythm with a rate of 79, no ectopy. Normal axis. Normal P wave. Normal QRS. Normal intervals. Normal ST and T waves. Impression: normal ECG. When compared with ECG of 05/03/2013 no significant changes are seen.  MDM   1. Palpitations   2. Hyperglycemia   3. Renal insufficiency    Palpitations with vague chest pain of uncertain cause. Old records are reviewed and he had a negative workup in the emergency department when seen one month ago. The fact that symptoms have not been progressing over the 5 weeks that they have been there somewhat reassuring. I discussed with him that he probably needs to have a Holter monitor done to assess what is happening to start when these episodes occur. However, since he will not see the cardiologist for more than 2 weeks, I will give him a therapeutic trial of a beta blocker he is given a prescription for metoprolol. He can let the cardiologist know if the metoprolol is helpful when he is seen for the scheduled appointment. Also of note, he he has hyperglycemia with blood sugar 157, and renal insufficiency with  creatinine of 1.42. These will need to be followed by his PCP. All of this information was discussed with patient and his wife.    Dione Booze, MD 05/31/13 [id number]

## 2013-06-01 NOTE — Telephone Encounter (Signed)
Please let him know it can take a few days to be read and I will call him as soon as I have the results

## 2013-06-01 NOTE — Telephone Encounter (Signed)
Left message instructing patient to call back and/or I will try again later.

## 2013-06-08 ENCOUNTER — Encounter: Payer: Self-pay | Admitting: Neurology

## 2013-06-08 ENCOUNTER — Ambulatory Visit (INDEPENDENT_AMBULATORY_CARE_PROVIDER_SITE_OTHER): Payer: BC Managed Care – PPO | Admitting: Neurology

## 2013-06-08 VITALS — BP 131/87 | HR 53 | Ht 69.0 in | Wt 164.0 lb

## 2013-06-08 DIAGNOSIS — G253 Myoclonus: Secondary | ICD-10-CM

## 2013-06-08 DIAGNOSIS — I951 Orthostatic hypotension: Secondary | ICD-10-CM | POA: Insufficient documentation

## 2013-06-08 DIAGNOSIS — R251 Tremor, unspecified: Secondary | ICD-10-CM

## 2013-06-08 DIAGNOSIS — R259 Unspecified abnormal involuntary movements: Secondary | ICD-10-CM

## 2013-06-08 MED ORDER — PRAMIPEXOLE DIHYDROCHLORIDE 0.125 MG PO TABS: 0.1250 mg | ORAL_TABLET | Freq: Three times a day (TID) | ORAL | Status: DC

## 2013-06-08 MED ORDER — CARBIDOPA-LEVODOPA 25-100 MG PO TABS: ORAL_TABLET | ORAL | Status: DC

## 2013-06-08 NOTE — Progress Notes (Signed)
Provider:  Dr Hosie Poisson Referring Provider: [name], MD Primary Care Physician:  [name], MD  CC:  tremor  HPI:  Dustin Rogers is a 47 y.o. male here as a follow up and MRI results.  Since his last visit he has been to the ER for palpitations and chest tightness. He is scheduled to followup with cardiology. He feels the myoclonic jerks are getting worse. They occur predominantly with rest, improve when he is walking. Tremor on the left upper extremity involve the right upper extremity and trunk. Continues to have episodes of lightheadedness on standing up. Continues to feel stiff with slow walking intermittent rest tremor on the left side noted. He also notes difficulty with getting the words out, note a wants to say the words gets stuck in his throat.  MRI reviewed is unremarkable.   Prior visit 05/23/2013: Dustin Rogers is a pleasant 47 year old gentleman who presents for initial evaluation of tremor, fatigue, dizziness and muscle spasms. This has been ongoing for around a year getting progressively worse. It is left-sided predominant, without rest tremor component. Patients physical exam was pertinent for orthostatic blood pressure changes, mild left hand rest tremor, bradykinesia on the left side and decreased arm swing on the left. Based on his clinical history and symptoms there is concern for a underlying parkinsonism. With multiple autonomic features would especially consider atypical such as MSA. This was discussed extensively with the patient. Will undergo lab workup and brain MRI. Patient to return to office once workup is complete to discuss further steps.  Concerns/Questions:Review of Systems: Out of a complete 14 system review, the patient complains of only the following symptoms, and all other reviewed systems are negative. Positive for Weight loss fatigue blurred vision chest pain palpation shortness of breath numbness weakness slurred speech dizziness tremor sleepiness cramps  aching muscles constipation urinary problems allergies itching History   Social History  . Marital Status: Married    Spouse Name: Dustin Rogers    Number of Children: 3  . Years of Education: college   Occupational History  . CAD DESIGNER    Social History Main Topics  . Smoking status: Never Smoker   . Smokeless tobacco: Never Used  . Alcohol Use: 0.6 oz/week    1 Cans of beer per week     Comment: OCC  . Drug Use: No  . Sexual Activity: Not on file   Other Topics Concern  . Not on file   Social History Narrative   Patient is a Public librarian. Patient is married Dustin Rogers)  and works full time. [name]   [employer].          Family History  Problem Relation Age of Onset  . High blood pressure Mother   . High blood pressure Father   . Liver cancer Paternal Grandfather     Past Medical History  Diagnosis Date  . Hypertension   . Herniated disc   . Seasonal allergies   . Dizziness   . Tremor     hands  . Weight loss   . Fatigue     Past Surgical History  Procedure Laterality Date  . Lumbar disck      2012    Current Outpatient Prescriptions  Medication Sig Dispense Refill  . metoprolol (LOPRESSOR) 25 MG tablet Take 1 tablet (25 mg total) by mouth 2 (two) times daily.  25 tablet  0  . Multiple Vitamins-Minerals (MULTIVITAMIN PO) Take 1 tablet by mouth daily.       Marland Kitchen  olmesartan (BENICAR) 20 MG tablet Take 40 mg by mouth daily.       . tadalafil (CIALIS) 20 MG tablet Take 20 mg by mouth daily as needed for erectile dysfunction.      Marland Kitchen tinidazole (TINDAMAX) 500 MG tablet        No current facility-administered medications for this visit.    Allergies as of 06/08/2013  . (No Known Allergies)    Vitals: There were no vitals taken for this visit. Last Weight:  Wt Readings from Last 1 Encounters:  05/30/13 165 lb (74.844 kg)   Last Height:   Ht Readings from Last 1 Encounters:  05/30/13 5\' 9"  (1.753 m)   Manual BP check Prone 126/78 P  78 Standing 102/70 P 80  Physical exam:  Exam:  Gen: NAD, conversant  Eyes: anicteric sclerae, moist conjunctivae  HENT: Atraumatic  Neck: Trachea midline; supple,  Lungs: CTA, no wheezing, rales, rhonic  CV: RRR, no MRG  Abdomen: Soft, non-tender;  Extremities: No peripheral edema  Skin: Normal temperature, no rash,  Psych: Appropriate affect, pleasant  Neuro:  MS: AA&Ox3, appropriately interactive, normal affect   Speech: fluent w/o paraphasic error   Memory: good recent and remote recall   CN:  PERRL, EOMI no nystagmus, no ptosis, sensation intact to LT V1-V3 bilat, face symmetric, no weakness, hearing grossly intact, palate elevates symmetrically, shoulder shrug 5/5 bilat,  tongue protrudes midline, no fasiculations noted.   Motor: normal bulk and tone, frequent myoclonic jerks of trunk and proximal LUE muscles noted, No asterixis noted Strength:  5/5 In all extremities   Reflexes: symmetrical, bilat downgoing toes  Sens: LT intact in all extremities, diminished LT bilat LE L>R   Brief Motor UPDRS  Speech: mild tremor/stuttering in speech  Facial Expression: decreased blink rate  Tremor:  Rest  R 0  L 1  Action/postural  R 1(postural)  L 1(postural)  Rigidity:  bilat UE 1  Finger taps:  R:0  L:1  Open/close hands:  R:0  L:1  Foot taps:  R:0  L:1 Arising from Chair:  0  Gait/FOG:  Slightly wide based, decreased arm swing on left, 3 step turn. Negative Rhomberg. Mild lack of postural response with pull test   Assessment:  After physical and neurologic examination, review of laboratory studies, imaging, neurophysiology testing and pre-existing records, assessment will be reviewed on the problem list.  Plan:  Treatment plan and additional workup will be reviewed under Problem List.  1)Tremor 2)Myoclonus 3)Gait instability 4)Orthostatic hypotension  Dustin Rogers is a pleasant 47 year old gentleman who presents for follow up  of tremor, fatigue,  dizziness and muscle spasms. This has been ongoing for around a year getting progressively worse. It is left-sided predominant, with minimal rest tremor component. Patients physical exam was pertinent for orthostatic blood pressure changes, myoclonus, mild left hand rest tremor, bradykinesia on the left side and decreased arm swing on the left. Based on his clinical history and symptoms there is concern for a underlying parkinsonism. With multiple autonomic features would especially consider atypical such as MSA. This was discussed extensively with the patient. Will undergo lab workup. Will try patient on low dose Mirapex. Counseled on potential side effects.

## 2013-06-08 NOTE — Patient Instructions (Addendum)
Overall you are doing fairly well but I do want to suggest a few things today:   Remember to drink plenty of fluid, eat healthy meals and do not skip any meals. Try to eat protein with a every meal and eat a healthy snack such as fruit or nuts in between meals. Try to keep a regular sleep-wake schedule and try to exercise daily, particularly in the form of walking, 20-30 minutes a day, if you can.   As far as your medications are concerned, I would like to suggest starting a medication called Mirapex 0.125mg . Start by taking this medication 3 times a day  As far as diagnostic testing: We will do some blood work today  I would like to see you back in 3 to 4 months, sooner if we need to. Please call us with any interim questions, concerns, problems, updates or refill requests.   Please also call us for any test results so we can go over those with you on the phone.  My clinical assistant and will answer any of your questions and relay your messages to me and also relay most of my messages to you.   Our phone number is [phone]. We also have an after hours call service for urgent matters and there is a physician on-call for urgent questions. For any emergencies you know to call 911 or go to the nearest emergency room

## 2013-06-09 LAB — CERULOPLASMIN: Ceruloplasmin: 22.8 mg/dL (ref 15.0–30.0)

## 2013-06-09 LAB — HEPATIC FUNCTION PANEL
ALT: 42 IU/L (ref 0–44)
AST: 21 IU/L (ref 0–40)
Alkaline Phosphatase: 73 IU/L (ref 39–117)
Bilirubin, Direct: 0.16 mg/dL (ref 0.00–0.40)
Total Protein: 7.2 g/dL (ref 6.0–8.5)

## 2013-06-09 LAB — TSH: TSH: 1.84 u[IU]/mL (ref 0.450–4.500)

## 2013-06-14 ENCOUNTER — Other Ambulatory Visit: Payer: Self-pay | Admitting: Neurology

## 2013-06-14 DIAGNOSIS — G20 Parkinson's disease: Secondary | ICD-10-CM

## 2013-06-14 NOTE — Progress Notes (Signed)
Quick Note:  Spoke with patient and relayed results of blood work. I discussed Dr Minus Breeding request for a f/u appointment with Dr Frances Furbish for a 2nd opinion. The patient was in agreement with Dr Minus Breeding request and is expecting a call to have this scheduled.  ______

## 2013-06-16 ENCOUNTER — Ambulatory Visit (INDEPENDENT_AMBULATORY_CARE_PROVIDER_SITE_OTHER): Payer: BC Managed Care – PPO | Admitting: Cardiovascular Disease

## 2013-06-16 ENCOUNTER — Encounter: Payer: Self-pay | Admitting: Cardiovascular Disease

## 2013-06-16 ENCOUNTER — Encounter (INDEPENDENT_AMBULATORY_CARE_PROVIDER_SITE_OTHER): Payer: Self-pay

## 2013-06-16 VITALS — BP 120/64 | HR 61 | Ht 69.0 in | Wt 168.0 lb

## 2013-06-16 DIAGNOSIS — I10 Essential (primary) hypertension: Secondary | ICD-10-CM

## 2013-06-16 DIAGNOSIS — R259 Unspecified abnormal involuntary movements: Secondary | ICD-10-CM

## 2013-06-16 DIAGNOSIS — R251 Tremor, unspecified: Secondary | ICD-10-CM

## 2013-06-16 DIAGNOSIS — R002 Palpitations: Secondary | ICD-10-CM | POA: Insufficient documentation

## 2013-06-16 DIAGNOSIS — R079 Chest pain, unspecified: Secondary | ICD-10-CM

## 2013-06-16 NOTE — Progress Notes (Signed)
Patient ID: Dustin Rogers, male   DOB: 05/25/1966, 47 y.o.   MRN: [id number] 47 yo referred by ER for chest pain and palpitations.  Over the past year has noted a lot of vague neurologic symptoms.  Twitching in arms and legs RLE weakness UE tremors  Has an aunt with parkinsons  ER visits reviewed. With neuro symptoms has had some palpitations.  Improved on beta blocker given for tremor.  With palpitations has had central SSCP.  Squeezing and not sharp.  Last minutes Not with exertion but usually with palpitations. No dyspnea.  Vision and hand writing ok.  No bowel or bladder symptoms Denies history of ead injurty or seizures.  No syncope   Labs in ER normal including TSH   ROS: Denies fever, malais, weight loss, blurry vision, decreased visual acuity, cough, sputum, SOB, hemoptysis, pleuritic pain, palpitaitons, heartburn, abdominal pain, melena, lower extremity edema, claudication, or rash.  All other systems reviewed and negative   General: Affect appropriate Healthy:  appears stated age HEENT: normal Neck supple with no adenopathy JVP normal no bruits no thyromegaly Lungs clear with no wheezing and good diaphragmatic motion Heart:  S1/S2 no murmur,rub, gallop or click PMI normal Abdomen: benighn, BS positve, no tenderness, no AAA no bruit.  No HSM or HJR Distal pulses intact with no bruits No edema Neuro non-focal Skin warm and dry No muscular weakness  Medications Current Outpatient Prescriptions  Medication Sig Dispense Refill  . BENICAR 40 MG tablet Take 1 tablet by mouth daily.      . metoprolol (LOPRESSOR) 25 MG tablet Take 1 tablet (25 mg total) by mouth 2 (two) times daily.  25 tablet  0  . Multiple Vitamins-Minerals (MULTIVITAMIN PO) Take 1 tablet by mouth daily.       . pramipexole (MIRAPEX) 0.125 MG tablet Take 1 tablet (0.125 mg total) by mouth 3 (three) times daily.  90 tablet  3  . tadalafil (CIALIS) 20 MG tablet Take 20 mg by mouth daily as needed for erectile  dysfunction.      Marland Kitchen tinidazole (TINDAMAX) 500 MG tablet        No current facility-administered medications for this visit.    Allergies Review of patient's allergies indicates no known allergies.  Family History: Family History  Problem Relation Age of Onset  . High blood pressure Mother   . High blood pressure Father   . Liver cancer Paternal Grandfather     Social History: History   Social History  . Marital Status: Married    Spouse Name: [name]    Number of Children: 3  . Years of Education: college   Occupational History  . CAD DESIGNER    Social History Main Topics  . Smoking status: Never Smoker   . Smokeless tobacco: Never Used  . Alcohol Use: 0.6 oz/week    1 Cans of beer per week     Comment: OCC  . Drug Use: No  . Sexual Activity: Not on file   Other Topics Concern  . 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 [name]   [employer].          Electrocardiogram:  9/24  NSR rate 79 normal ECG   Assessment and Plan

## 2013-06-16 NOTE — Assessment & Plan Note (Signed)
Benign and infrequent no need for monitor ECG normal improved with beta blocker.

## 2013-06-16 NOTE — Patient Instructions (Addendum)
Your physician recommends that you schedule a follow-up appointment in:   AS NEEDED   Your physician recommends that you continue on your current medications as directed. Please refer to the Current Medication list given to you today.   Your physician has requested that you have a stress echocardiogram. For further information please visit [email or web address]. Please follow instruction sheet as given.  

## 2013-06-16 NOTE — Assessment & Plan Note (Signed)
Atypical  F/U stress echo  Normal exam ECG and negative labs in ER

## 2013-06-16 NOTE — Assessment & Plan Note (Signed)
Well controlled.  Continue current medications and low sodium Dash type diet.   Tendency towards orthostasis also suggests autonomic nervous issue and not primary cardiac or vascular

## 2013-06-16 NOTE — Assessment & Plan Note (Signed)
Continue beta blocker encouraged him to f/[facility] for definitive diagnosis

## 2013-06-19 ENCOUNTER — Telehealth: Payer: Self-pay | Admitting: Neurology

## 2013-06-19 ENCOUNTER — Emergency Department (HOSPITAL_COMMUNITY): Payer: BC Managed Care – PPO

## 2013-06-19 ENCOUNTER — Encounter (HOSPITAL_COMMUNITY): Payer: Self-pay | Admitting: Emergency Medicine

## 2013-06-19 ENCOUNTER — Emergency Department (HOSPITAL_COMMUNITY)
Admission: EM | Admit: 2013-06-19 | Discharge: 2013-06-20 | Disposition: A | Discharge status: Home / Self Care | Payer: BC Managed Care – PPO | Attending: Emergency Medicine | Admitting: Emergency Medicine

## 2013-06-19 DIAGNOSIS — R0789 Other chest pain: Secondary | ICD-10-CM | POA: Insufficient documentation

## 2013-06-19 DIAGNOSIS — G20 Parkinson's disease: Secondary | ICD-10-CM | POA: Insufficient documentation

## 2013-06-19 DIAGNOSIS — G20A1 Parkinson's disease without dyskinesia, without mention of fluctuations: Secondary | ICD-10-CM | POA: Insufficient documentation

## 2013-06-19 DIAGNOSIS — R61 Generalized hyperhidrosis: Secondary | ICD-10-CM | POA: Insufficient documentation

## 2013-06-19 DIAGNOSIS — I10 Essential (primary) hypertension: Secondary | ICD-10-CM | POA: Insufficient documentation

## 2013-06-19 DIAGNOSIS — Z8739 Personal history of other diseases of the musculoskeletal system and connective tissue: Secondary | ICD-10-CM | POA: Insufficient documentation

## 2013-06-19 DIAGNOSIS — Z79899 Other long term (current) drug therapy: Secondary | ICD-10-CM | POA: Insufficient documentation

## 2013-06-19 DIAGNOSIS — R002 Palpitations: Secondary | ICD-10-CM | POA: Insufficient documentation

## 2013-06-19 DIAGNOSIS — R0602 Shortness of breath: Secondary | ICD-10-CM | POA: Insufficient documentation

## 2013-06-19 DIAGNOSIS — R079 Chest pain, unspecified: Secondary | ICD-10-CM

## 2013-06-19 LAB — BASIC METABOLIC PANEL WITH GFR
BUN: 21 mg/dL (ref 6–23)
CO2: 24 meq/L (ref 19–32)
Calcium: 8.8 mg/dL (ref 8.4–10.5)
Chloride: 104 meq/L (ref 96–112)
Creatinine, Ser: 1.2 mg/dL (ref 0.50–1.35)
GFR calc Af Amer: 83 mL/min — ABNORMAL LOW
GFR calc non Af Amer: 72 mL/min — ABNORMAL LOW
Glucose, Bld: 133 mg/dL — ABNORMAL HIGH (ref 70–99)
Potassium: 4 meq/L (ref 3.5–5.1)
Sodium: 141 meq/L (ref 135–145)

## 2013-06-19 LAB — CBC
HCT: 39.2 % (ref 39.0–52.0)
Hemoglobin: 13.2 g/dL (ref 13.0–17.0)
MCH: 29.4 pg (ref 26.0–34.0)
MCHC: 33.7 g/dL (ref 30.0–36.0)
MCV: 87.3 fL (ref 78.0–100.0)
Platelets: 258 10*3/uL (ref 150–400)
RBC: 4.49 MIL/uL (ref 4.22–5.81)
RDW: 14.8 % (ref 11.5–15.5)
WBC: 8.5 10*3/uL (ref 4.0–10.5)

## 2013-06-19 LAB — POCT I-STAT TROPONIN I: Troponin i, poc: 0 ng/mL (ref 0.00–0.08)

## 2013-06-19 MED ORDER — SODIUM CHLORIDE 0.9 % IV BOLUS (SEPSIS)
1000.0000 mL | Freq: Once | INTRAVENOUS | Status: AC
  Administered 2013-06-19: 1000 mL via INTRAVENOUS

## 2013-06-19 MED ORDER — MORPHINE SULFATE 4 MG/ML IJ SOLN
4.0000 mg | Freq: Once | INTRAMUSCULAR | Status: DC
  Filled 2013-06-19: qty 1

## 2013-06-19 MED ORDER — METOPROLOL TARTRATE 25 MG PO TABS
25.0000 mg | ORAL_TABLET | Freq: Once | ORAL | Status: AC
  Administered 2013-06-20: 25 mg via ORAL
  Filled 2013-06-19: qty 1

## 2013-06-19 MED ORDER — ONDANSETRON HCL 4 MG/2ML IJ SOLN
4.0000 mg | Freq: Once | INTRAMUSCULAR | Status: DC
Start: 2013-06-19 — End: 2013-06-20
  Filled 2013-06-19: qty 2

## 2013-06-19 NOTE — ED Notes (Signed)
Per EMS pt has a hx of parkinson's dx and has a cardiac hx, pt was en route to the pharmacy to fill his metoprolol. Pt began to have severe chest pressure in the center of his chest and palpitations. Per EMS pt took goody powder and it helped relieve his sx temporarily, however the chest pressure returned and he called EMS. Pt was adm 1 nitroglycerin tablet, pt's pain is now a 2/10.

## 2013-06-19 NOTE — ED Provider Notes (Signed)
CSN: [id number]     Arrival date & time 06/19/13  1854 History   First MD Initiated Contact with Patient 06/19/13 1855     Chief Complaint  Patient presents with  . Chest Pain   (Consider location/radiation/quality/duration/timing/severity/associated sxs/prior Treatment) The history is provided by the patient. No language interpreter was used.  Dustin Rogers is a 47 y/o M with PMHx of HTN, herniated disc, dizziness, tremors, fatigue presenting to the ED with chest pain that started suddenly today while the patient was driving and then another episode when he was resting in the car at the gas station parking lot - two episode today. Patient reported that the pain occurred suddenly, without any warning, described the pain as a sharp pain to the center of his chest, without radiation to the left arm and back - patient reported that the pain felt like "knocked breath" out of him. Patient reported that the pain lasted for approximately 3-5 seconds. Patient reported that when the experienced the pain he got extremely nervous and had the sensation of impending doom. Patient reported that at the same time he was shaking and sweating. Patient reported that there was radiation up to his neck. Patient reported that he called EMS, patient was given SL nitroglycerin. Reported associated shortness of breath with chest pain episodes. Patient reported that he had similar episode of chest discomfort back in September 2014. Denied headache, dizziness, blurred vision, visual distortions, difficulty breathing, numbness, tingling, weakness, loss of sensation.  PCP Dr. [name] Cardiology [facility] Dr. Hosie Poisson - reported that he has been started on PD medications, has follow-up with Dr. Vallarie Mare tomorrow.    Past Medical History  Diagnosis Date  . Hypertension   . Herniated disc   . Seasonal allergies   . Dizziness   . Tremor     hands  . Weight loss   . Fatigue   . Parkinson's disease    Past Surgical History   Procedure Laterality Date  . Lumbar disck      2012   Family History  Problem Relation Age of Onset  . High blood pressure Mother   . High blood pressure Father   . Liver cancer Paternal Grandfather    History  Substance Use Topics  . Smoking status: Never Smoker   . Smokeless tobacco: Never Used  . Alcohol Use: 0.6 oz/week    1 Cans of beer per week     Comment: OCC    Review of Systems  Constitutional: Positive for diaphoresis. Negative for fever and chills.  Eyes: Negative for visual disturbance.  Respiratory: Positive for shortness of breath.   Cardiovascular: Positive for chest pain.  Neurological: Negative for dizziness, weakness, numbness and headaches.  All other systems reviewed and are negative.    Allergies  Review of patient's allergies indicates no known allergies.  Home Medications   Current Outpatient Rx  Name  Route  Sig  Dispense  Refill  . ALPRAZolam (XANAX PO)   Oral   Take 1 tablet by mouth once.         . Aspirin-Acetaminophen-Caffeine (GOODY HEADACHE PO)   Oral   Take 1 Package by mouth daily as needed (for pain).         . BENICAR 40 MG tablet   Oral   Take 1 tablet by mouth daily.         . metoprolol (LOPRESSOR) 25 MG tablet   Oral   Take 1 tablet (25 mg total) by mouth  2 (two) times daily.   25 tablet   0   . Multiple Vitamins-Minerals (MULTIVITAMIN PO)   Oral   Take 1 tablet by mouth daily.          . pramipexole (MIRAPEX) 0.125 MG tablet   Oral   Take 1 tablet (0.125 mg total) by mouth 3 (three) times daily.   90 tablet   3   . tadalafil (CIALIS) 20 MG tablet   Oral   Take 20 mg by mouth daily as needed for erectile dysfunction.          BP 135/76  Pulse 69  Resp 17  SpO2 99% Physical Exam  Nursing note and vitals reviewed. Constitutional: He is oriented to person, place, and time. He appears well-developed and well-nourished. No distress.  HENT:  Head: Normocephalic and atraumatic.  Eyes:  Conjunctivae and EOM are normal. Pupils are equal, round, and reactive to light. Right eye exhibits no discharge. Left eye exhibits no discharge.  Neck: Normal range of motion. Neck supple.  Cardiovascular: Normal rate, regular rhythm and normal heart sounds.  Exam reveals no friction rub.   No murmur heard. Pulses:      Radial pulses are 2+ on the right side, and 2+ on the left side.       Dorsalis pedis pulses are 2+ on the right side, and 2+ on the left side.  Cap refill < 3 seconds bilaterally Negative leg swelling, erythema, discoloration, pitting edema bilaterally  Pulmonary/Chest: Effort normal. No respiratory distress. He has no wheezes. He has no rales. He exhibits no tenderness.  Musculoskeletal: Normal range of motion.  Neurological: He is alert and oriented to person, place, and time. No cranial nerve deficit. He exhibits normal muscle tone. Coordination normal.  Skin: Skin is warm and dry. No rash noted. He is not diaphoretic. No erythema.  Psychiatric: He has a normal mood and affect. His behavior is normal. Thought content normal.    ED Course  Procedures (including critical care time)  This provider reviewed patient's chart. Patient seen in ED setting regarding chest discomfort in the past. Patient with seen on 05/03/2013 and 05/30/2013 was seen regarding chest pain - patient has history of palpitations. Patient was seen at [facility] by Dr. [name] where cardiologist reported that his palpitations is not cardiac in nature, but neurologically based.   11:57 PM Spoke with patient regarding labs and imaging. Patient reported that he has not experienced chest pain while in ED setting. Denied episodes of chest pain, chest tightness, shortness of breath, difficulty breathing. Patient reported that he experienced mild heart palpitations, stated that he has not taken Metroprolol today - PO dose ordered in ED setting. Discussed plan for discharge with patient - patient agreed.     Date: 06/19/2013  Rate: 77  Rhythm: normal sinus rhythm  QRS Axis: normal  Intervals: normal  ST/T Wave abnormalities: normal  Conduction Disutrbances:none  Narrative Interpretation:   Old EKG Reviewed: unchanged EKG analyzed and reviewed by this provider and attending physician.   Labs Review Labs Reviewed  BASIC METABOLIC PANEL - Abnormal; Notable for the following:    Glucose, Bld 133 (*)    GFR calc non Af Amer 72 (*)    GFR calc Af Amer 83 (*)    All other components within normal limits  CBC  PRO B NATRIURETIC PEPTIDE  POCT I-STAT TROPONIN I  POCT I-STAT TROPONIN I   Imaging Review Dg Chest 2 View  06/19/2013   CLINICAL  DATA:  Left chest pain, palpitations  EXAM: CHEST  2 VIEW  COMPARISON:  05/03/2013  FINDINGS: Lungs are clear. No pleural effusion or pneumothorax.  The heart is normal in size.  Visualized osseous structures are within normal limits.  IMPRESSION: Normal chest radiographs.   Electronically Signed   By: Charline Bills M.D.   On: 06/19/2013 20:37    EKG Interpretation   None       MDM   1. Chest pain   2. Palpitations     Patient presenting to emergency department with chest pain that started today while patient was driving to pick up medication. Patient reports the chest pain is localized to the Center the chest, described as if he got the breath knocked out of him with radiation up the neck. Patient reports that these chest pain episodes last approximately 3-5 seconds each, reported that in association to the chest pain he experienced shortness of breath, shaking, sweating in the sensation of impending doom. Patient reported that he had 2 episodes today. Patient reports he was seen in the emergency department in September 2014 for similar symptoms. Alert and oriented. Heart rate and rhythm normal-negative tachycardia identified. Lungs clear to auscultation bilaterally. Full range of motion to upper lower extremities bilaterally. Cap refill less than  3 seconds. Negative swelling, erythema, pitting edema identified to lower extremities. Pulses palpable and strong, distal and proximal. Strength intact to upper and lower extremities bilaterally, equal distribution identified. Negative neurological deficits identified. EKG negative ischemic changes noted, negative new findings to EKG. CBC negative findings. BMP negative findings. Pro BMP negative elevation. I-STAT troponin negative elevation. Chest x-ray negative findings. Second set of troponins negative elevation.  Patient stable, afebrile. Doubt PE - PERC score 0 - denied traveling, hemoptysis. Suspicion to be palpitations and anxiety relation. Cannot rule out cardiac issue - patient has cardiology follow-up. Patient scheduled for stress test on 07/17/2013. Discharged patient. Discussed with patient to call Cardiology tomorrow and set-up appointment for holter monitor and echo, recommended patient to see if stress test can be moved up to sooner date. Discussed with patient to continue to take at home medications as prescribed. Discussed with patient to rest and stay hydrated. Discussed with patient to keep appointment with neurologist tomorrow. Discussed with patient continue to monitor symptoms and if symptoms are to worsen or change to report back to the ED - strict return instructions given.  Patient agreed to plan of care, understood, all questions answered.   Raymon Mutton, PA-C 06/20/13 0006

## 2013-06-19 NOTE — Telephone Encounter (Signed)
completed

## 2013-06-19 NOTE — ED Notes (Signed)
PA at bedside.

## 2013-06-20 ENCOUNTER — Ambulatory Visit: Payer: BC Managed Care – PPO | Admitting: Neurology

## 2013-06-20 ENCOUNTER — Ambulatory Visit (INDEPENDENT_AMBULATORY_CARE_PROVIDER_SITE_OTHER): Payer: BC Managed Care – PPO | Admitting: Neurology

## 2013-06-20 ENCOUNTER — Encounter: Payer: Self-pay | Admitting: Neurology

## 2013-06-20 VITALS — BP 118/79 | HR 73 | Temp 98.4°F | Ht 69.0 in | Wt 168.0 lb

## 2013-06-20 DIAGNOSIS — G253 Myoclonus: Secondary | ICD-10-CM

## 2013-06-20 NOTE — Patient Instructions (Signed)
I think overall you are doing fairly well but I do want to suggest a few things today:  Remember to drink plenty of fluid, eat healthy meals and do not skip any meals. Try to eat protein with a every meal and eat a healthy snack such as fruit or nuts in between meals. Try to keep a regular sleep-wake schedule and try to exercise daily, particularly in the form of walking, 20-30 minutes a day, if you can.   As far as your medications are concerned, I would like to suggest no changes.     As far as diagnostic testing: MRI cervical spine, EEG, blood work.  I recommend you see Dr. Hosie Poisson in 3 months, sooner if needed. Please call us with any interim questions, concerns, problems, updates or refill requests.  Our phone number is [phone]. We also have an after hours call service for urgent matters and there is a physician on-call for urgent questions. For any emergencies you know to call 911 or go to the nearest emergency room.

## 2013-06-20 NOTE — Progress Notes (Signed)
Subjective:    Patient ID: VERNEL LANGENDERFER is a 47 y.o. male.  HPI  Dear Cindee Lame,  I saw your patient, Tayo Maute, upon your kind request in my clinic today for second opinion of his parkinsonism, concern for MSA. The patient is unaccompanied today. As you know, Mr. Gervase is a very pleasant 47 year old right-handed gentleman with an underlying medical history of hypertension, degenerative back disease, seasonal allergies, status post lumbar spine surgery in 2012, who has a history of tremors which started about a year ago and muscle jerks which started about a month ago. His tremor is worse on the left side and worse with nervous ness. He has also noted word finding difficulty. MRI brain was unremarkable in the recent past. His symptoms have been ongoing for about a year and are progressive. You last saw him on 06/08/2013 at which time you check TSH, ammonia level, ceruloplasmin level and liver function of which were fine. You also suggested a trial of Mirapex 0.125 mg strength 3 times a day. He reports no SE and feels better with the muscle jerking and twitching. He has muscle cramps in his legs. He had a brain MRI on 05/31/13, which was reported as normal without contrast. His MA had PD and passed away in her 36s. She had brain surgery, but had a stroke during the procedure. He has a cousin with lupus.  He endorses stress in the last months. He had to go to the ER last night d/t palpitations and CP. EKG was negative and CBC and BMP were negative. I-STAT troponin was negative. Chest x-ray was negative findings. Second set of troponins was negative.   His Past Medical History Is Significant For: Past Medical History  Diagnosis Date  . Hypertension   . Herniated disc   . Seasonal allergies   . Dizziness   . Tremor     hands  . Weight loss   . Fatigue   . Parkinson's disease     His Past Surgical History Is Significant For: Past Surgical History  Procedure Laterality Date  . Lumbar disck       2012    His Family History Is Significant For: Family History  Problem Relation Age of Onset  . High blood pressure Mother   . High blood pressure Father   . Liver cancer Paternal Grandfather     His Social History Is Significant For: History   Social History  . Marital Status: Married    Spouse Name: [name]    Number of Children: 3  . Years of Education: college   Occupational History  . CAD DESIGNER    Social History Main Topics  . Smoking status: Never Smoker   . Smokeless tobacco: Never Used  . Alcohol Use: 0.6 oz/week    1 Cans of beer per week     Comment: OCC  . Drug Use: No  . Sexual Activity: None   Other Topics Concern  . None   Social History Narrative   Patient is a Public librarian. Patient is married [name])  and works full time. [name]   [employer].          His Allergies Are:  No Known Allergies:   His Current Medications Are:  Outpatient Encounter Prescriptions as of 06/20/2013  Medication Sig Dispense Refill  . Aspirin-Acetaminophen-Caffeine (GOODY HEADACHE PO) Take 1 Package by mouth daily as needed (for pain).      . BENICAR 40 MG tablet Take 1 tablet by  mouth daily.      . metoprolol (LOPRESSOR) 25 MG tablet Take 1 tablet (25 mg total) by mouth 2 (two) times daily.  25 tablet  0  . Multiple Vitamins-Minerals (MULTIVITAMIN PO) Take 1 tablet by mouth daily.       . pramipexole (MIRAPEX) 0.125 MG tablet Take 1 tablet (0.125 mg total) by mouth 3 (three) times daily.  90 tablet  3  . tadalafil (CIALIS) 20 MG tablet Take 20 mg by mouth daily as needed for erectile dysfunction.      Marland Kitchen ALPRAZolam (XANAX PO) Take 1 tablet by mouth once.       No facility-administered encounter medications on file as of 06/20/2013.  :   Review of Systems  Constitutional: Positive for activity change, fatigue and unexpected weight change (loss).  Eyes: Positive for visual disturbance (blurred vision).  Cardiovascular: Positive for chest pain  and palpitations.  Gastrointestinal: Positive for constipation.  Musculoskeletal:       Cramps  Allergic/Immunologic: Positive for environmental allergies.  Neurological: Positive for dizziness, tremors and numbness.  Psychiatric/Behavioral: The patient is nervous/anxious.        Sleepiness    Objective:  Neurologic Exam  Physical Exam Physical Examination:   Filed Vitals:   06/20/13 1549  BP: 118/79  Pulse: 73  Temp: 98.4 F (36.9 C)   On repeat blood pressure testing he has a blood pressure of 124/84 with a pulse of 74 while sitting and a standing blood pressure 135/82 with a pulse of 80. He denies any orthostatic dizziness.  General Examination: The patient is a very pleasant 47 y.o. male in no acute distress. He appears well-developed and well-nourished and well groomed. He is mildly anxious appearing.  HEENT: Normocephalic, atraumatic, pupils are equal, round and reactive to light and accommodation. Extraocular tracking is good without limitation to gaze excursion or nystagmus noted. Normal smooth pursuit is noted. Hearing is grossly intact. Face is symmetric with normal facial animation and normal facial sensation. Speech is clear with no dysarthria noted. There is no hypophonia. There is no lip, neck/head, jaw or voice tremor. Neck is supple with full range of passive and active motion. There are no carotid bruits on auscultation. Oropharynx exam reveals: mild mouth dryness, adequate dental hygiene and moderate airway crowding, due to elongated tongue and narrow airway entry. Mallampati is class II. Tongue protrudes centrally and palate elevates symmetrically. He has no abnormal facial twitching or grimacing. He has normal eye blink rate. He has no limitation to his gaze.  Chest: Clear to auscultation without wheezing, rhonchi or crackles noted.  Heart: S1+S2+0, regular and normal without murmurs, rubs or gallops noted.   Abdomen: Soft, non-tender and non-distended with normal  bowel sounds appreciated on auscultation.  Extremities: There is no pitting edema in the distal lower extremities bilaterally. Pedal pulses are intact.  Skin: Warm and dry without trophic changes noted. There are no varicose veins.  Musculoskeletal: exam reveals no obvious joint deformities, tenderness or joint swelling or erythema.   Neurologically:  Mental status: The patient is awake, alert and oriented in all 4 spheres. His memory, attention, language and knowledge are appropriate. There is no aphasia, agnosia, apraxia or anomia. Speech is clear with normal prosody and enunciation. Thought process is linear. Mood is congruent and affect is blunted.  Cranial nerves are as described above under HEENT exam. In addition, shoulder shrug is normal with equal shoulder height noted. Motor exam: Normal bulk, strength and tone is noted. There is no  drift, tremor or rebound. Romberg testing shows swaying backwards and the patient makes an attempt to hold onto the exam table behind him twice. I asked him to step away from the exam table at which time on Romberg testing he had minimal swaying. Reflexes are 2+ throughout. Fine motor skills are intact with normal finger taps, normal hand movements, normal rapid alternating patting, normal foot taps and normal foot agility. He does not have a resting tremor. He really does not have much in the way of postural or action tremor. He had no decrement in amplitude with his fine motor testing. He has occasional upper body sudden jerking. This is more pronounced when I first walked into the room and becomes less as the visit progresses. There is no athetosis or chorea.  Cerebellar testing shows no dysmetria or intention tremor on finger to nose testing. Heel to shin is unremarkable bilaterally. There is no truncal or gait ataxia.  Sensory exam is intact to light touch.  Gait, station and balance: He stands up without difficulty and does not need to push himself up.  Posture is normal. He can stand narrow based. He walks with preserved arm swing bilaterally. He has an occasional catch in his knees bilaterally or unilaterally but has no tendency to fall. He turns well. He has intact tandem walk and intact toe and heel stance is noted.               Assessment and Plan:   In summary, Antonio A Rivero is a very pleasant 47 y.o.-year old male with a history of tremors, history of orthostatic hypotension, myoclonic jerking. On my examination he has what appears to be myoclonic jerks. They are somewhat distractible. His Romberg testing was suspicious for a tendency to sway backwards but this changed as he stepped away from the exam table from behind. I really do not detect much in the way of parkinsonism at this time. He did not have any abnormal orthostatic blood pressure or pulse findings today. Of course he is on a small dose of a dopamine agonist at this time which may be masking some of his signs, however he really does not have any truncal or appendicular rigidity or problems with his fine motor skills in terms of decrement in amplitude. I am not sure that he has any evidence of atypical parkinsonism at this time. Nevertheless, for his myoclonus I would suggest some further workup including autoimmune and inflammatory markers, cervical spine MRI as well as an EEG to help determine whether these are epileptogenic myocloni. I wonder if there is a functional overlay. Of note, he does endorse quite a few stressors which he does not elaborate on today. I suggested that he followup with you in about 3 months and that we do these tests in the interim. I did not make any changes to his medication regimen at this time but he may want to discuss with him tapering him off of Mirapex. I talked to the patient at length today and explained my findings to him. I also explained to him that stress can certainly exacerbate or bring out neurological symptoms. At this juncture I will see him  back on an as-needed basis and he will followup with you.   I answered all his questions today and the patient was in agreement with the above outlined plan.  Thank you very much for allowing me to participate in the care of this nice patient. If I can be of any further assistance  to you please do not hesitate to call.  Sincerely,   Huston Foley, MD, PhD

## 2013-06-21 ENCOUNTER — Telehealth: Payer: Self-pay | Admitting: Neurology

## 2013-06-21 NOTE — ED Provider Notes (Signed)
Medical screening examination/treatment/procedure(s) were performed by non-physician practitioner and as supervising physician I was immediately available for consultation/collaboration.   Layla Maw Marji Kuehnel, DO 06/21/13 2249

## 2013-06-22 NOTE — Telephone Encounter (Signed)
Spoke to patient.  Answered questions about last OV per Dr. Teofilo Pod assessment and plan. Patient agreed.

## 2013-06-27 ENCOUNTER — Other Ambulatory Visit (INDEPENDENT_AMBULATORY_CARE_PROVIDER_SITE_OTHER): Payer: BC Managed Care – PPO | Admitting: Radiology

## 2013-06-27 ENCOUNTER — Other Ambulatory Visit: Payer: BC Managed Care – PPO

## 2013-06-27 DIAGNOSIS — G253 Myoclonus: Secondary | ICD-10-CM

## 2013-06-29 ENCOUNTER — Ambulatory Visit (INDEPENDENT_AMBULATORY_CARE_PROVIDER_SITE_OTHER): Payer: BC Managed Care – PPO

## 2013-06-29 DIAGNOSIS — G253 Myoclonus: Secondary | ICD-10-CM

## 2013-06-29 LAB — ENA+DNA/DS+SJORGEN'S
ENA RNP Ab: <0.2 AI (ref 0.0–0.9)
dsDNA Ab: <1 IU/mL (ref 0–9)

## 2013-06-29 LAB — HEAVY METALS PROFILE II, BLOOD
Arsenic: 4 ug/L (ref 2–23)
Mercury: 1.3 ug/L (ref 0.0–14.9)

## 2013-06-29 LAB — CK TOTAL AND CKMB (NOT AT ARMC)
CK-MB Index: 2.4 ng/mL (ref 0.0–5.0)
Total CK: 141 U/L (ref 24–204)

## 2013-06-29 LAB — C-REACTIVE PROTEIN: CRP: 1.4 mg/L (ref 0.0–4.9)

## 2013-06-29 NOTE — Progress Notes (Signed)
Quick Note:  Pete: I had ordered additional testing in this patient I saw for second opinion as you remember. His RPR, ESR, aldolase, CRP, heavy metals in his blood as well as CK levels were unremarkable with the exception of a positive ANA with positive Sjgren antibody I believe. Do you want to talk about this with him? Perhaps a referral to rheumatology is in order? I do not know how this all ties in with his presentation which has been quite unusual.  Huston Foley, MD, PhD [facility] ([facility]  ______

## 2013-06-30 NOTE — Progress Notes (Signed)
Quick Note:  Triage: Please call and advise the patient that the recent MRI scan of the neck showed mild degenerative changes in his neck. Nothing to explain his muscle twitching, in particular no compression of or lesion in his cervical spinal cord. ______

## 2013-07-03 ENCOUNTER — Telehealth: Payer: Self-pay | Admitting: Neurology

## 2013-07-04 ENCOUNTER — Other Ambulatory Visit: Payer: Self-pay | Admitting: Neurology

## 2013-07-04 DIAGNOSIS — R531 Weakness: Secondary | ICD-10-CM

## 2013-07-04 DIAGNOSIS — R251 Tremor, unspecified: Secondary | ICD-10-CM

## 2013-07-04 DIAGNOSIS — G253 Myoclonus: Secondary | ICD-10-CM

## 2013-07-04 DIAGNOSIS — R202 Paresthesia of skin: Secondary | ICD-10-CM

## 2013-07-05 NOTE — Telephone Encounter (Signed)
Message copied by Hermenia Fiscal on Wed Jul 05, 2013  1:16 PM ------      Message from: Wynonia Lawman      Created: Wed Jul 05, 2013 11:43 AM      Contact: [phone]       Patient would like to know the results of his EEG.  Please call him at above number.            Sandy P. ------

## 2013-07-10 ENCOUNTER — Ambulatory Visit (HOSPITAL_BASED_OUTPATIENT_CLINIC_OR_DEPARTMENT_OTHER): Payer: BC Managed Care – PPO

## 2013-07-10 ENCOUNTER — Telehealth: Payer: Self-pay | Admitting: *Deleted

## 2013-07-10 ENCOUNTER — Ambulatory Visit (HOSPITAL_COMMUNITY): Payer: BC Managed Care – PPO | Attending: Cardiovascular Disease

## 2013-07-10 DIAGNOSIS — I10 Essential (primary) hypertension: Secondary | ICD-10-CM | POA: Insufficient documentation

## 2013-07-10 DIAGNOSIS — R002 Palpitations: Secondary | ICD-10-CM

## 2013-07-10 DIAGNOSIS — R0989 Other specified symptoms and signs involving the circulatory and respiratory systems: Secondary | ICD-10-CM

## 2013-07-10 DIAGNOSIS — R079 Chest pain, unspecified: Secondary | ICD-10-CM | POA: Insufficient documentation

## 2013-07-10 NOTE — Progress Notes (Signed)
Echocardiogram performed.  

## 2013-07-10 NOTE — Telephone Encounter (Signed)
I called pt and relayed the normal EEG results.   Released to mychart.  Pt verbalized understanding.

## 2013-07-11 ENCOUNTER — Other Ambulatory Visit: Payer: Self-pay | Admitting: *Deleted

## 2013-07-11 NOTE — Telephone Encounter (Signed)
I gave results of EEG to pt.

## 2013-07-13 ENCOUNTER — Other Ambulatory Visit: Payer: Self-pay

## 2013-09-07 DIAGNOSIS — R4189 Other symptoms and signs involving cognitive functions and awareness: Secondary | ICD-10-CM

## 2013-09-07 HISTORY — DX: Other symptoms and signs involving cognitive functions and awareness: R41.89

## 2013-09-20 ENCOUNTER — Ambulatory Visit: Payer: BC Managed Care – PPO | Admitting: Neurology

## 2013-09-21 ENCOUNTER — Encounter: Payer: Self-pay | Admitting: Neurology

## 2013-09-21 ENCOUNTER — Ambulatory Visit (INDEPENDENT_AMBULATORY_CARE_PROVIDER_SITE_OTHER): Payer: BC Managed Care – PPO | Admitting: Neurology

## 2013-09-21 VITALS — BP 121/78 | HR 51 | Ht 70.0 in | Wt 167.0 lb

## 2013-09-21 DIAGNOSIS — F09 Unspecified mental disorder due to known physiological condition: Secondary | ICD-10-CM

## 2013-09-21 DIAGNOSIS — G253 Myoclonus: Secondary | ICD-10-CM

## 2013-09-21 DIAGNOSIS — R4189 Other symptoms and signs involving cognitive functions and awareness: Secondary | ICD-10-CM

## 2013-09-21 NOTE — Progress Notes (Signed)
Subjective:    Patient ID: Dustin Rogers is a 48 y.o. male presenting for follow up visit.   Patient reports is stable since last visit which was with Dr. Frances FurbishAthar. Since last visit he has had rheum testing with a positive Sjogren's antibody. He was followed up with Dr. Dierdre ForthBeekman who suggested a biopsy to help confirm the diagnosis. He notes that the large amplitude jerking episodes have calmed down but do still continue occasionally. He continues to have frequent episodes of small muscle twitching. His wife notes concern with worsening cognition. Most of the trouble with short term memory. Continues to have lightheaded episodes upon standing, has had a few presyncopal episodes. States he is to hydrating well, does have a dry mouth.Was evaluated by Rheumatology since last visit, they discussed possibility of a biopsy to confirm if this Sjogrens. Saw Dr Dierdre ForthBeekman, rheumatology, considering a biopsy.   Prior visit 06/2013:  I saw your patient, Dustin Rogers, upon your kind request in my clinic today for second opinion of his parkinsonism, concern for MSA. The patient is unaccompanied today. As you know, Dustin Rogers is a very pleasant 48 year old right-handed gentleman with an underlying medical history of hypertension, degenerative back disease, seasonal allergies, status post lumbar spine surgery in 2012, who has a history of tremors which started about a year ago and muscle jerks which started about a month ago. His tremor is worse on the left side and worse with nervous ness. He has also noted word finding difficulty. MRI brain was unremarkable in the recent past. His symptoms have been ongoing for about a year and are progressive. You last saw him on 06/08/2013 at which time you check TSH, ammonia level, ceruloplasmin level and liver function of which were fine. You also suggested a trial of Mirapex 0.125 mg strength 3 times a day. He reports no SE and feels better with the muscle jerking and twitching. He has  muscle cramps in his legs. He had a brain MRI on 05/31/13, which was reported as normal without contrast. His MA had PD and passed away in her 5370s. She had brain surgery, but had a stroke during the procedure. He has a cousin with lupus.  He endorses stress in the last months. He had to go to the ER last night d/t palpitations and CP. EKG was negative and CBC and BMP were negative. I-STAT troponin was negative. Chest x-ray was negative findings. Second set of troponins was negative.   His Past Medical History Is Significant For: Past Medical History  Diagnosis Date  . Hypertension   . Herniated disc   . Seasonal allergies   . Dizziness   . Tremor     hands  . Weight loss   . Fatigue   . Parkinson's disease     His Past Surgical History Is Significant For: Past Surgical History  Procedure Laterality Date  . Lumbar disck      2012    His Family History Is Significant For: Family History  Problem Relation Age of Onset  . High blood pressure Mother   . High blood pressure Father   . Liver cancer Paternal Grandfather     His Social History Is Significant For: History   Social History  . Marital Status: Married    Spouse Name: Selena BattenKim    Number of Children: 3  . Years of Education: college   Occupational History  . CAD DESIGNER    Social History Main Topics  . Smoking status: Never Smoker   .  Smokeless tobacco: Never Used  . Alcohol Use: 0.6 oz/week    1 Cans of beer per week     Comment: OCC  . Drug Use: No  . Sexual Activity: None   Other Topics Concern  . None   Social History Narrative   Patient is a Public librarian. Patient is married [name])  and works full time. [name]   [employer].   Caffeine consumption is 1 cup a day          His Allergies Are:  No Known Allergies:   His Current Medications Are:  Outpatient Encounter Prescriptions as of 09/21/2013  Medication Sig  . Aspirin-Acetaminophen-Caffeine (GOODY HEADACHE PO) Take 1 Package  by mouth daily as needed (for pain).  . BENICAR 40 MG tablet Take 1 tablet by mouth daily.  . metoprolol (LOPRESSOR) 25 MG tablet Take 1 tablet (25 mg total) by mouth 2 (two) times daily.  . Multiple Vitamins-Minerals (MULTIVITAMIN PO) Take 1 tablet by mouth daily.   . pramipexole (MIRAPEX) 0.125 MG tablet Take 1 tablet (0.125 mg total) by mouth 3 (three) times daily.  . tadalafil (CIALIS) 20 MG tablet Take 20 mg by mouth daily as needed for erectile dysfunction.  . [DISCONTINUED] ALPRAZolam (XANAX PO) Take 1 tablet by mouth once.  :   Review of Systems  Constitutional: Positive for activity change, fatigue and unexpected weight change (loss).  Eyes: Positive for visual disturbance (blurred vision).  Cardiovascular: Positive for chest pain and palpitations.  Gastrointestinal: Positive for constipation.  Musculoskeletal:       Cramps  Allergic/Immunologic: Positive for environmental allergies.  Neurological: Positive for dizziness, tremors and numbness.  Psychiatric/Behavioral: The patient is nervous/anxious.        Sleepiness    Objective:  Neurologic Exam  Physical Exam Physical Examination:   Filed Vitals:   09/21/13 1429  BP: 121/78  Pulse: 51   On repeat blood pressure testing he has a blood pressure of 124/84 with a pulse of 74 while sitting and a standing blood pressure 135/82 with a pulse of 80. He denies any orthostatic dizziness.  General Examination: The patient is a very pleasant 48 y.o. male in no acute distress. He appears well-developed and well-nourished and well groomed. He is mildly anxious appearing.  HEENT: Normocephalic, atraumatic, pupils are equal, round and reactive to light and accommodation. Extraocular tracking is good without limitation to gaze excursion or nystagmus noted. Normal smooth pursuit is noted. Hearing is grossly intact. Face is symmetric with normal facial animation and normal facial sensation. Speech is clear with no dysarthria noted. There  is no hypophonia. There is no lip, neck/head, jaw or voice tremor. Neck is supple with full range of passive and active motion. There are no carotid bruits on auscultation. Oropharynx exam reveals: mild mouth dryness, adequate dental hygiene and moderate airway crowding, due to elongated tongue and narrow airway entry. Mallampati is class II. Tongue protrudes centrally and palate elevates symmetrically. He has no abnormal facial twitching or grimacing. He has normal eye blink rate. He has no limitation to his gaze.  Chest: Clear to auscultation without wheezing, rhonchi or crackles noted.  Heart: S1+S2+0, regular and normal without murmurs, rubs or gallops noted.   Abdomen: Soft, non-tender and non-distended with normal bowel sounds appreciated on auscultation.  Extremities: There is no pitting edema in the distal lower extremities bilaterally. Pedal pulses are intact.  Skin: Warm and dry without trophic changes noted. There are no varicose veins.  Musculoskeletal: exam reveals  no obvious joint deformities, tenderness or joint swelling or erythema.   Neurologically:  Mental status: The patient is awake, alert and oriented in all 4 spheres. His memory, attention, language and knowledge are appropriate. There is no aphasia, agnosia, apraxia or anomia. Speech is clear with normal prosody and enunciation. Thought process is linear. Mood is congruent and affect is blunted.  Cranial nerves are as described above under HEENT exam. In addition, shoulder shrug is normal with equal shoulder height noted. Motor exam: Normal bulk, strength and tone is noted. There is no drift, tremor or rebound. Romberg testing shows swaying backwards and the patient makes an attempt to hold onto the exam table behind him twice. I asked him to step away from the exam table at which time on Romberg testing he had minimal swaying. Reflexes are 2+ throughout. Fine motor skills are intact with normal finger taps, normal hand  movements, normal rapid alternating patting, normal foot taps and normal foot agility. He does not have a resting tremor. He really does not have much in the way of postural or action tremor. He had no decrement in amplitude with his fine motor testing. He has occasional upper body sudden jerking. This is more pronounced when I first walked into the room and becomes less as the visit progresses. There is no athetosis or chorea.  Cerebellar testing shows no dysmetria or intention tremor on finger to nose testing. Heel to shin is unremarkable bilaterally. There is no truncal or gait ataxia.  Sensory exam is intact to light touch.  Gait, station and balance: He stands up without difficulty and does not need to push himself up. Posture is normal. He can stand narrow based. He walks with preserved arm swing bilaterally. He has an occasional catch in his knees bilaterally or unilaterally but has no tendency to fall. He turns well. He has intact tandem walk and intact toe and heel stance is noted.               Assessment and Plan:   1)orthostatic hypotension 2)myoclonus 3)cognitive decline  In summary, Dustin Rogers is a very pleasant 48 y.o.-year old male with a history of tremors, history of orthostatic hypotension, myoclonic jerking. On my examination he has what appears to be myoclonic jerks. He does note a history of multiple autonomic complaints. Workup so far has been unremarkable with the exception of a positive Sjogrens antibody. Scheduled to follow up with rheumatology for biopsy. An atypical parkinsonism, such as MSA, continues to be on the differential though his history/exam is not classic for this. Will await results of rheum workup/biopsy. Will check EMG/NCS and formal cognitive testing. Will taper patient off of Mirapex. Follow up once biopsy completed.

## 2013-09-21 NOTE — Patient Instructions (Signed)
Overall you are doing fairly well but I do want to suggest a few things today:   Remember to drink plenty of fluid, eat healthy meals and do not skip any meals. Try to eat protein with a every meal and eat a healthy snack such as fruit or nuts in between meals. Try to keep a regular sleep-wake schedule and try to exercise daily, particularly in the form of walking, 20-30 minutes a day, if you can.   As far as your medications are concerned, I would like to suggest you taper off the Mirapex using the following schedule: Week 1 and 2: decrease to 1 tablet twice a day Week 3 and 4: decrease to 1 tablet daily then discontinue  We will order a nerve conduction study. Please schedule this when you check out  I would like you to have formal cognitive testing with neuro-psychology. Someone will call you to schedule this  Please follow up with Dr Dierdre ForthBeekman for further evaluation of your Sjogrens.   I would like to see you back in 3 to 4 months, once the above workup is completed, sooner if we need to. Please call us with any interim questions, concerns, problems, updates or refill requests.   Please also call us for any test results so we can go over those with you on the phone.  My clinical assistant and will answer any of your questions and relay your messages to me and also relay most of my messages to you.   Our phone number is [phone]. We also have an after hours call service for urgent matters and there is a physician on-call for urgent questions. For any emergencies you know to call 911 or go to the nearest emergency room

## 2013-09-28 ENCOUNTER — Emergency Department (HOSPITAL_COMMUNITY)
Admission: EM | Admit: 2013-09-28 | Discharge: 2013-09-28 | Disposition: A | Discharge status: Home / Self Care | Payer: BC Managed Care – PPO | Attending: Emergency Medicine | Admitting: Emergency Medicine

## 2013-09-28 ENCOUNTER — Emergency Department (HOSPITAL_COMMUNITY): Payer: BC Managed Care – PPO

## 2013-09-28 DIAGNOSIS — G20 Parkinson's disease: Secondary | ICD-10-CM | POA: Insufficient documentation

## 2013-09-28 DIAGNOSIS — I10 Essential (primary) hypertension: Secondary | ICD-10-CM | POA: Insufficient documentation

## 2013-09-28 DIAGNOSIS — R072 Precordial pain: Secondary | ICD-10-CM | POA: Insufficient documentation

## 2013-09-28 DIAGNOSIS — Z8719 Personal history of other diseases of the digestive system: Secondary | ICD-10-CM | POA: Insufficient documentation

## 2013-09-28 DIAGNOSIS — R55 Syncope and collapse: Secondary | ICD-10-CM | POA: Insufficient documentation

## 2013-09-28 DIAGNOSIS — R079 Chest pain, unspecified: Secondary | ICD-10-CM

## 2013-09-28 DIAGNOSIS — G20A1 Parkinson's disease without dyskinesia, without mention of fluctuations: Secondary | ICD-10-CM | POA: Insufficient documentation

## 2013-09-28 DIAGNOSIS — R002 Palpitations: Secondary | ICD-10-CM | POA: Insufficient documentation

## 2013-09-28 DIAGNOSIS — Z79899 Other long term (current) drug therapy: Secondary | ICD-10-CM | POA: Insufficient documentation

## 2013-09-28 LAB — CBC
HEMATOCRIT: 43.1 % (ref 39.0–52.0)
HEMOGLOBIN: 14.6 g/dL (ref 13.0–17.0)
MCH: 29.9 pg (ref 26.0–34.0)
MCHC: 33.9 g/dL (ref 30.0–36.0)
MCV: 88.1 fL (ref 78.0–100.0)
Platelets: 302 10*3/uL (ref 150–400)
RBC: 4.89 MIL/uL (ref 4.22–5.81)
RDW: 14.1 % (ref 11.5–15.5)
WBC: 7.5 10*3/uL (ref 4.0–10.5)

## 2013-09-28 LAB — BASIC METABOLIC PANEL
BUN: 18 mg/dL (ref 6–23)
CO2: 23 mEq/L (ref 19–32)
Calcium: 9.4 mg/dL (ref 8.4–10.5)
Chloride: 100 mEq/L (ref 96–112)
Creatinine, Ser: 1.13 mg/dL (ref 0.50–1.35)
GFR calc non Af Amer: 77 mL/min — ABNORMAL LOW (ref >90)
GFR, EST AFRICAN AMERICAN: 89 mL/min — AB (ref >90)
Glucose, Bld: 106 mg/dL — ABNORMAL HIGH (ref 70–99)
POTASSIUM: 4.1 meq/L (ref 3.7–5.3)
Sodium: 139 mEq/L (ref 137–147)

## 2013-09-28 LAB — POCT I-STAT TROPONIN I: Troponin i, poc: 0 ng/mL (ref 0.00–0.08)

## 2013-09-28 LAB — PRO B NATRIURETIC PEPTIDE: Pro B Natriuretic peptide (BNP): 20.1 pg/mL (ref 0–125)

## 2013-09-28 NOTE — ED Provider Notes (Signed)
CSN: [id number]     Arrival date & time 09/28/13  1945 History   First MD Initiated Contact with Patient 09/28/13 2025     Chief Complaint  Patient presents with  . Chest Pain  . Dizziness   (Consider location/radiation/quality/duration/timing/severity/associated sxs/prior Treatment) Patient is a 48 y.o. male presenting with chest pain. The history is provided by the patient and the spouse.  Chest Pain Pain location:  Substernal area Pain quality: dull, sharp and stabbing   Pain radiates to:  Does not radiate Pain radiates to the back: no   Pain severity:  Mild Onset quality:  Sudden Duration:  1 day Timing:  Intermittent Progression:  Waxing and waning Chronicity:  Recurrent Context: at rest   Context: not breathing, not lifting and not raising an arm   Relieved by: metoprolol. Worsened by:  Nothing tried Ineffective treatments:  None tried Associated symptoms: palpitations and syncope   Associated symptoms: no abdominal pain, no anxiety, no claudication, no cough, no dizziness, no fatigue, no fever, no headache, no lower extremity edema, no nausea, no shortness of breath and not vomiting     Past Medical History  Diagnosis Date  . Hypertension   . Herniated disc   . Seasonal allergies   . Dizziness   . Tremor     hands  . Weight loss   . Fatigue   . Parkinson's disease    Past Surgical History  Procedure Laterality Date  . Lumbar disck      2012   Family History  Problem Relation Age of Onset  . High blood pressure Mother   . High blood pressure Father   . Liver cancer Paternal Grandfather    History  Substance Use Topics  . Smoking status: Never Smoker   . Smokeless tobacco: Never Used  . Alcohol Use: 0.6 oz/week    1 Cans of beer per week     Comment: OCC    Review of Systems  Constitutional: Negative for fever, chills and fatigue.  HENT: Negative for congestion and facial swelling.   Eyes: Negative for discharge and visual disturbance.   Respiratory: Negative for cough and shortness of breath.   Cardiovascular: Positive for chest pain, palpitations and syncope. Negative for claudication.  Gastrointestinal: Negative for nausea, vomiting, abdominal pain and diarrhea.  Musculoskeletal: Negative for arthralgias and myalgias.  Skin: Negative for color change and rash.  Neurological: Negative for dizziness, tremors, syncope and headaches.  Psychiatric/Behavioral: Negative for confusion and dysphoric mood.    Allergies  Review of patient's allergies indicates no known allergies.  Home Medications   Current Outpatient Rx  Name  Route  Sig  Dispense  Refill  . Aspirin-Acetaminophen-Caffeine (GOODY HEADACHE PO)   Oral   Take 1 Package by mouth daily as needed (for pain).         . BENICAR 40 MG tablet   Oral   Take 1 tablet by mouth daily.         . metoprolol (LOPRESSOR) 25 MG tablet   Oral   Take 1 tablet (25 mg total) by mouth 2 (two) times daily.   25 tablet   0   . Multiple Vitamins-Minerals (MULTIVITAMIN PO)   Oral   Take 1 tablet by mouth daily.          . pramipexole (MIRAPEX) 0.125 MG tablet   Oral   Take 1 tablet (0.125 mg total) by mouth 3 (three) times daily.   90 tablet   3   .  tadalafil (CIALIS) 20 MG tablet   Oral   Take 20 mg by mouth daily as needed for erectile dysfunction.          BP 121/78  Pulse 61  Temp(Src) 98.1 F (36.7 C) (Oral)  Resp 17  Ht 5\' 10"  (1.778 m)  Wt 165 lb 9.6 oz (75.116 kg)  BMI 23.76 kg/m2  SpO2 99% Physical Exam  Constitutional: He is oriented to person, place, and time. He appears well-developed and well-nourished.  HENT:  Head: Normocephalic and atraumatic.  Eyes: EOM are normal. Pupils are equal, round, and reactive to light.  Neck: Normal range of motion. Neck supple. No JVD present.  Cardiovascular: Normal rate and regular rhythm.  Exam reveals no gallop and no friction rub.   No murmur heard. Pulmonary/Chest: No respiratory distress. He has  no wheezes.  Abdominal: He exhibits no distension. There is no rebound and no guarding.  Musculoskeletal: Normal range of motion.  Neurological: He is alert and oriented to person, place, and time.  Skin: No rash noted. No pallor.  Psychiatric: He has a normal mood and affect. His behavior is normal.    ED Course  Procedures (including critical care time) Labs Review Labs Reviewed  BASIC METABOLIC PANEL - Abnormal; Notable for the following:    Glucose, Bld 106 (*)    GFR calc non Af Amer 77 (*)    GFR calc Af Amer 89 (*)    All other components within normal limits  CBC  PRO B NATRIURETIC PEPTIDE  POCT I-STAT TROPONIN I   Imaging Review Dg Chest 2 View  09/28/2013   CLINICAL DATA:  Chest pain and dizziness.  EXAM: CHEST  2 VIEW  COMPARISON:  06/09/2013  FINDINGS: The heart size and mediastinal contours are within normal limits. Both lungs are clear. The visualized skeletal structures are unremarkable.  IMPRESSION: Normal chest.   Electronically Signed   By: Geanie Cooley M.D.   On: 09/28/2013 21:36    EKG Interpretation    Date/Time:  Thursday September 28 2013 19:47:35 EST Ventricular Rate:  81 PR Interval:  144 QRS Duration: 78 QT Interval:  362 QTC Calculation: 420 R Axis:   89 Text Interpretation:  Normal sinus rhythm Biatrial enlargement Abnormal ECG No significant change since last tracing Confirmed by SHELDON  MD, CHARLES ([id number]) on 09/28/2013 9:15:18 PM            MDM   1. Palpitations   2. Chest pain     Patient is a 48 year old male with a chief complaint of chest pain and syncopal episode. Patient states that these been going on for the past month. States that he starts the chest palpitations and then has an uncomfortable feeling in his chest and sometimes has a syncopal episode. Patient has been worked up prior to this but concern for ACS had a negative stress test with cardiology. Patient denies any prior prolonged to telemetry monitoring. Patient with  prolonged neurologic workup for this.  Patient well appearing on exam pain much reduced from earlier today that extremity in the palpitations currently EKG unremarkable as read by me. X-ray negative as read by me.  Troponin unmeasurable CBC BMP unremarkable. Patient low risk for ACS based on history and risk factors. With stable vital signs we'll discharge home he will follow up with cardiology for probable Holter monitor.   I have discussed the diagnosis/risks/treatment options with the patient and family and believe the pt to be eligible for discharge home to  follow-up with Cardiology. We also discussed returning to the ED immediately if new or worsening sx occur. We discussed the sx which are most concerning (e.g., syncope, repeat ) that necessitate immediate return. Any new prescriptions provided to the patient are listed below.  Discharge Medication List as of 09/28/2013  9:51 PM         Melene Planan Khairi Garman, MD 09/29/13 [phone]

## 2013-09-28 NOTE — ED Notes (Signed)
Patient transported to X-ray 

## 2013-09-28 NOTE — ED Notes (Signed)
Pt reports several episodes where he has "blacked out." Pt reports these episodes usually occur when he is being active. Reports this has happened two times this month.

## 2013-09-28 NOTE — Discharge Instructions (Signed)
Follow up with your PCP.  Cardiac Event Monitoring A cardiac event monitor is a small recording device used to help detect abnormal heart rhythms (arrhythmias). The monitor is used to record heart rhythm when noticeable symptoms such as the following occur:  Fast heart beats (palpitations), such as heart racing or fluttering.  Dizziness.  Fainting or lightheadedness.  Unexplained weakness. The monitor is wired to two electrodes placed on your chest. Electrodes are flat, sticky disks that attach to your skin. The monitor can be worn for up to 30 days. You will wear the monitor at all times, except when bathing.  HOW TO USE YOUR CARDIAC EVENT MONITOR A technician will prepare your chest for the electrode placement. The technician will show you how to place the electrodes, how to work the monitor, and how to replace the batteries. Take time to practice using the monitor before you leave the office. Make sure you understand how to send the information from the monitor to your health care provider. This requires a telephone with a landline, not a cellphone. You need to:  Wear your monitor at all times, except when you are in water:  Do not get the monitor wet.  Take the monitor off when bathing. Do not swim or use a hot tub with it on.  Keep your skin clean. Do not put body lotion or moisturizer on your chest.  Change the electrodes daily or any time they stop sticking to your skin. You might need to use tape to keep them on.  It is possible that your skin under the electrodes could become irritated. To keep this from happening, try to put the electrodes in slightly different places on your chest. However, they must remain in the area under your left breast and in the upper right section of your chest.  Make sure the monitor is safely clipped to your clothing or in a location close to your body that your health care provider recommends.  Press the button to record when you feel symptoms of  heart trouble, such as dizziness, weakness, lightheadedness, palpitations, thumping, shortness of breath, unexplained weakness, or a fluttering or racing heart. The monitor is always on and records what happened slightly before you pressed the button, so do not worry about being too late to get good information.  Keep a diary of your activities, such as walking, doing chores, and taking medicine. It is especially important to note what you were doing when you pushed the button to record your symptoms. This will help your health care provider determine what might be contributing to your symptoms. The information stored in your monitor will be reviewed by your health care provider alongside your diary entries.  Send the recorded information as recommended by your health care provider. It is important to understand that it will take some time for your health care provider to process the results.  Change the batteries as recommended by your health care provider. SEEK IMMEDIATE MEDICAL CARE IF:   You have chest pain.  You have extreme difficulty breathing or shortness of breath.  You develop a very fast heartbeat that persists.  You develop dizziness that does not go away .  You faint or constantly feel you are about to faint. Document Released: 06/02/2008 Document Revised: 04/26/2013 Document Reviewed: 02/20/2013 [facility] Patient Information 2014 [employer].

## 2013-09-28 NOTE — ED Notes (Signed)
Pt c/o chest discomfort and dizziness for the past few days. Because the pain and dizziness did not go away tonight is the reason why he decided to come in. Pt states chest pain is intermittent with palpitations. Pt reports one episode of dizziness. Pt is A&Ox4, respirations equal and unlabored, skin warm and dry.

## 2013-09-29 NOTE — ED Provider Notes (Signed)
I saw and evaluated the patient, reviewed the resident's note and I agree with the findings and plan.  EKG Interpretation    Date/Time:  Thursday September 28 2013 19:47:35 EST Ventricular Rate:  81 PR Interval:  144 QRS Duration: 78 QT Interval:  362 QTC Calculation: 420 R Axis:   89 Text Interpretation:  Normal sinus rhythm Biatrial enlargement Abnormal ECG No significant change since last tracing Confirmed by Annamary Buschman  MD, Emberlyn Burlison ([id number]) on 09/28/2013 9:15:18 PM            CP, dizziness off and on for months, has had extensive neuro eval, needs cards as well. No acute findings in the ED.   Geanine Vandekamp B. Bernette MayersSheldon, MD 09/29/13 [phone]

## 2013-10-17 ENCOUNTER — Encounter (INDEPENDENT_AMBULATORY_CARE_PROVIDER_SITE_OTHER): Payer: Self-pay

## 2013-10-17 ENCOUNTER — Ambulatory Visit (INDEPENDENT_AMBULATORY_CARE_PROVIDER_SITE_OTHER): Payer: BC Managed Care – PPO | Admitting: Diagnostic Neuroimaging

## 2013-10-17 DIAGNOSIS — G253 Myoclonus: Secondary | ICD-10-CM

## 2013-10-17 DIAGNOSIS — Z0289 Encounter for other administrative examinations: Secondary | ICD-10-CM

## 2013-10-19 NOTE — Procedures (Signed)
   [facility]  NCS (NERVE CONDUCTION STUDY) WITH EMG (ELECTROMYOGRAPHY) REPORT   STUDY DATE: 10/17/13 PATIENT NAME: Dustin Rogers DOB: Nov 21, 1965 MRN: [id number]  ORDERING CLINICIAN: Elspeth ChoPeter Sumner, DO   TECHNOLOGIST: Gearldine ShownLorraine Jones ELECTROMYOGRAPHER: Glenford BayleyVikram R. Kim Oki, MD  CLINICAL INFORMATION: 48 year old male with intermittent muscle twitches.  FINDINGS: NERVE CONDUCTION STUDY: Right median, right ulnar, right peroneal, right tibial motor responses have normal distal latencies, amplitudes, conduction velocities and F-wave latencies. Right median and right ulnar and right peroneal sensory responses are normal.  NEEDLE ELECTROMYOGRAPHY: Needle examination of muscles of the right upper and lower extremity (deltoid, biceps, triceps, flexor carpi radialis, first dorsal interosseous, vastus medialis, tibialis anterior, gastrocnemius) shows no abnormal spontaneous activity at rest and normal motor unit recruitment on exertion.  IMPRESSION:  Normal study. No electrodiagnostic evidence of large fiber neuropathy, myopathy or myotonia at this time.   INTERPRETING PHYSICIAN:  Suanne MarkerVIKRAM R. Broxton Broady, MD Certified in Neurology, Neurophysiology and Neuroimaging  [facility] [address], [address] [place], [place] [address] [phone]

## 2013-10-19 NOTE — Progress Notes (Signed)
Quick Note:  Called patient informed him of his EMG/NCS results, patient expressed understanding, he was informed if had any other questions or concerns to give us a call back ______

## 2014-01-10 ENCOUNTER — Encounter (HOSPITAL_COMMUNITY): Payer: Self-pay | Admitting: Emergency Medicine

## 2014-01-10 ENCOUNTER — Emergency Department (HOSPITAL_COMMUNITY)
Admission: EM | Admit: 2014-01-10 | Discharge: 2014-01-10 | Disposition: A | Discharge status: Home / Self Care | Payer: BC Managed Care – PPO | Attending: Emergency Medicine | Admitting: Emergency Medicine

## 2014-01-10 DIAGNOSIS — Z79899 Other long term (current) drug therapy: Secondary | ICD-10-CM | POA: Insufficient documentation

## 2014-01-10 DIAGNOSIS — M542 Cervicalgia: Secondary | ICD-10-CM | POA: Insufficient documentation

## 2014-01-10 DIAGNOSIS — I10 Essential (primary) hypertension: Secondary | ICD-10-CM | POA: Insufficient documentation

## 2014-01-10 DIAGNOSIS — G20A1 Parkinson's disease without dyskinesia, without mention of fluctuations: Secondary | ICD-10-CM | POA: Insufficient documentation

## 2014-01-10 DIAGNOSIS — G20 Parkinson's disease: Secondary | ICD-10-CM | POA: Insufficient documentation

## 2014-01-10 HISTORY — DX: Orthostatic hypotension: I95.1

## 2014-01-10 HISTORY — DX: Myoclonus: G25.3

## 2014-01-10 HISTORY — DX: Other cervical disc degeneration, unspecified cervical region: M50.30

## 2014-01-10 HISTORY — DX: Other symptoms and signs involving cognitive functions and awareness: R41.89

## 2014-01-10 HISTORY — DX: Other chronic pain: G89.29

## 2014-01-10 HISTORY — DX: Dorsalgia, unspecified: M54.9

## 2014-01-10 MED ORDER — CYCLOBENZAPRINE HCL 10 MG PO TABS
10.0000 mg | ORAL_TABLET | Freq: Once | ORAL | Status: AC
  Administered 2014-01-10: 10 mg via ORAL
  Filled 2014-01-10: qty 1

## 2014-01-10 MED ORDER — KETOROLAC TROMETHAMINE 60 MG/2ML IM SOLN
60.0000 mg | Freq: Once | INTRAMUSCULAR | Status: AC
  Administered 2014-01-10: 60 mg via INTRAMUSCULAR
  Filled 2014-01-10: qty 2

## 2014-01-10 MED ORDER — TRAMADOL HCL 50 MG PO TABS: 50.0000 mg | ORAL_TABLET | Freq: Four times a day (QID) | ORAL | Status: DC | PRN

## 2014-01-10 MED ORDER — CYCLOBENZAPRINE HCL 10 MG PO TABS: 10.0000 mg | ORAL_TABLET | Freq: Two times a day (BID) | ORAL | Status: DC | PRN

## 2014-01-10 NOTE — ED Provider Notes (Signed)
Medical screening examination/treatment/procedure(s) were performed by non-physician practitioner and as supervising physician I was immediately available for consultation/collaboration.   EKG Interpretation None       Katia Hannen, MD 01/10/14 2249 

## 2014-01-10 NOTE — ED Notes (Signed)
Patient arrives from home with complaint of right sided neck pain. Explains that the pain started suddenly while in bathroom. States that he took a shower and got out. Pain stated to be spontaneous with no particular precipitating event. States no recent injury, accident, or exertion which may have injured his neck.

## 2014-01-10 NOTE — ED Provider Notes (Signed)
CSN: [id number]     Arrival date & time 01/10/14  0630 History   First MD Initiated Contact with Patient 01/10/14 0636     Chief Complaint  Patient presents with  . Neck Pain     (Consider location/radiation/quality/duration/timing/severity/associated sxs/prior Treatment) HPI Comments: Patient is a 48 year old male with a past medical history of hypertension and parkinson's disease who presents with right sided neck pain that started suddenly last night when he got out of the shower. Patient reports sudden onset and persistent pain. The pain is aching and moderate to severe. Certain neck movements and palpation of the affected area make the pain worse. No alleviating factors. Patient applied heat last night for about 30 minutes which provided no relief. Patient denies any injury.    Past Medical History  Diagnosis Date  . Hypertension   . Herniated disc   . Seasonal allergies   . Dizziness   . Tremor     hands  . Weight loss   . Fatigue   . Parkinson's disease    Past Surgical History  Procedure Laterality Date  . Lumbar disck      2012   Family History  Problem Relation Age of Onset  . High blood pressure Mother   . High blood pressure Father   . Liver cancer Paternal Grandfather    History  Substance Use Topics  . Smoking status: Never Smoker   . Smokeless tobacco: Never Used  . Alcohol Use: 0.6 oz/week    1 Cans of beer per week     Comment: OCC    Review of Systems  Constitutional: Negative for fever, chills and fatigue.  HENT: Negative for trouble swallowing.   Eyes: Negative for visual disturbance.  Respiratory: Negative for shortness of breath.   Cardiovascular: Negative for chest pain and palpitations.  Gastrointestinal: Negative for nausea, vomiting, abdominal pain and diarrhea.  Genitourinary: Negative for dysuria and difficulty urinating.  Musculoskeletal: Positive for neck pain. Negative for arthralgias.  Skin: Negative for color change.   Neurological: Negative for dizziness and weakness.  Psychiatric/Behavioral: Negative for dysphoric mood.      Allergies  Review of patient's allergies indicates no known allergies.  Home Medications   Prior to Admission medications   Medication Sig Start Date End Date Taking? Authorizing Provider  Aspirin-Acetaminophen-Caffeine (GOODY HEADACHE PO) Take 1 Package by mouth daily as needed (for pain).    Historical Provider, MD  BENICAR 40 MG tablet Take 1 tablet by mouth daily. 06/01/13   Historical Provider, MD  metoprolol (LOPRESSOR) 25 MG tablet Take 1 tablet (25 mg total) by mouth 2 (two) times daily. 05/31/13   Dione Boozeavid Glick, MD  Multiple Vitamins-Minerals (MULTIVITAMIN PO) Take 1 tablet by mouth daily.     Historical Provider, MD  pramipexole (MIRAPEX) 0.125 MG tablet Take 1 tablet (0.125 mg total) by mouth 3 (three) times daily. 06/08/13   Omelia BlackwaterPeter Justin Sumner, DO  tadalafil (CIALIS) 20 MG tablet Take 20 mg by mouth daily as needed for erectile dysfunction.    Historical Provider, MD   BP 126/83  Pulse 72  Temp(Src) 98.3 F (36.8 C) (Oral)  Resp 18  Ht 5\' 9"  (1.753 m)  Wt 165 lb (74.844 kg)  BMI 24.36 kg/m2  SpO2 98% Physical Exam  Nursing note and vitals reviewed. Constitutional: He is oriented to person, place, and time. He appears well-developed and well-nourished. No distress.  HENT:  Head: Normocephalic and atraumatic.  Eyes: Conjunctivae and EOM are normal. Pupils  are equal, round, and reactive to light.  Neck: Normal range of motion. Neck supple.  Cardiovascular: Normal rate and regular rhythm.  Exam reveals no gallop and no friction rub.   No murmur heard. George's test negative.   Pulmonary/Chest: Effort normal and breath sounds normal. He has no wheezes. He has no rales. He exhibits no tenderness.  Abdominal: Soft.  Musculoskeletal: Normal range of motion.  Right paraspinal cervical tenderness to palpation. No midline spine tenderness to palpation.    Neurological: He is alert and oriented to person, place, and time. Coordination normal.  Speech is goal-oriented. Moves limbs without ataxia.   Skin: Skin is warm and dry.  Psychiatric: He has a normal mood and affect. His behavior is normal.    ED Course  Procedures (including critical care time) Labs Review Labs Reviewed - No data to display  Imaging Review No results found.   EKG Interpretation None      MDM   Final diagnoses:  Neck pain on right side    6:59 AM Patient likely has pain from muscle spasm. Patient will have IM toradol here and Flexeril PO. Patient has no neuro deficits to suggest ischemic changes. Vitals stable and patient afebrile. Pain is reproducible with palpation. Patient likely having muscle spasm. Patient instructed to apply heat.     Emilia BeckKaitlyn Mindi Akerson, PA-C 01/10/14 1042

## 2014-01-10 NOTE — Discharge Instructions (Signed)
Take Tramadol as needed for pain. Take Flexeril as needed for muscle spasm. You may take these medications together. Follow up with your doctor as needed. Return to the ED with worsening or concerning symptoms.

## 2014-02-05 ENCOUNTER — Ambulatory Visit: Payer: Self-pay | Admitting: Neurology

## 2014-02-12 ENCOUNTER — Encounter: Payer: Self-pay | Admitting: Neurology

## 2014-02-12 ENCOUNTER — Ambulatory Visit (INDEPENDENT_AMBULATORY_CARE_PROVIDER_SITE_OTHER): Payer: BC Managed Care – PPO | Admitting: Neurology

## 2014-02-12 VITALS — BP 98/68 | HR 72 | Ht 69.0 in | Wt 169.0 lb

## 2014-02-12 DIAGNOSIS — M5416 Radiculopathy, lumbar region: Secondary | ICD-10-CM

## 2014-02-12 DIAGNOSIS — R55 Syncope and collapse: Secondary | ICD-10-CM

## 2014-02-12 DIAGNOSIS — IMO0002 Reserved for concepts with insufficient information to code with codable children: Secondary | ICD-10-CM

## 2014-02-12 DIAGNOSIS — R209 Unspecified disturbances of skin sensation: Secondary | ICD-10-CM

## 2014-02-12 NOTE — Patient Instructions (Addendum)
1.  I did not see evidence of parkinsonism today or any other neurodegenerative process 2.  I am going to order an MRI of the lumbar spine. We have scheduled you at [facility] for your MRI on 03/02/14 at 8:00 am. Please arrive 15 minutes prior and go to 1st floor radiology. If you need to reschedule for any reason please call [phone]. 3.  I would recommend that perhaps you get your lightheadness and passing out checked out by cardiology, but will leave that to the discretion of your PCP.  I recommend that you do not drive given the episode of passing out. 4.  If everything is negative, perhaps a referral to a tertiary care center would be of benefit

## 2014-02-12 NOTE — Progress Notes (Signed)
Dustin Rogers was seen today in the movement disorders clinic for neurologic consultation at the request of [name], MD.  The consultation is for a second opinion regarding Parkinsonism.  Pt has previously seen both Dr. Hosie Poisson and Frances Furbish for opinions regarding the same and I had the opportunity to review those opinions.  He also saw Dr. Marjory Lies for EMG that was normal.  He initially saw Dr. Hosie Poisson 05/23/13 and the initial impression was possible parkinsonism with myoclonus and he was ultimately given a trial of mirapex on 06/08/13.  Pt feels that this caused the increase in tremors and jerking and it was d/c.   He saw Dr. Frances Furbish on 06/20/13 for a second opinion and she did not think that he was parkinsonian and thought that perhaps there was a functional overlay.  He then had then EMG with Dr. Marjory Lies on 10/17/13 that was normal.  Pt reports that sx's started with fatigue x 2-3 years, despite the fact that he cut back on hours of the job.  He had "muscle twiching."  He would describe this as both "like worms crawling under the mm" and "big jerking movements of the arms and legs."  He has numbness in toes of both feet and the calf on the R.  He has had episodes of dizziness, described as confusion, vertigo and loss of balance.  It is worse when he stretches in the AM.  He describes one "big" episode where he fell, hit his nose and had loss of consciousness for a few minutes.  There was no loss of bladder or bowel control.  He didn't realize he was at home when he first came to, but has no idea how long.     He describes BP fluctuations but hasn't discussed this with his PCP.  His wife apparently has been doing orthostatics on him at home but he forgot to bring the numbers with him today.   He has head pressure (not HA) especially with lying down.  He has no loss of bladder but does "dribbble."  He describes severe m cramping for no reason and states that he had to go to the ER for that.  No specific  location in the body.  Specific Symptoms:  Tremor: nonot tremor, but m spasm and jerking of the mm Voice: had stuttering problems as a child, but better as adult Sleep: difficulty sleeping  Vivid Dreams:  yes but always have felt real  Acting out dreams:  no Wet Pillows: no Postural symptoms:  yes (intermittent "may" be off balance)  Falls?  no Bradykinesia symptoms: no bradykinesia noted Loss of smell:  no Loss of taste:  no Urinary Incontinence:  no but does "dribble" Difficulty Swallowing:  no unless "I'm nervous or something upset me".  States that had BW for sjogrens and saw rheum and then ent and had biopsy and that was negative Handwriting, micrographia: no different, always written small Trouble with ADL's:  no  Trouble buttoning clothing: no Depression:  no Memory changes:  yes (short term) Hallucinations:  no  visual distortions: no N/V:  no Lightheaded:  yes  Syncope: yes, as above  Diplopia:  no Dyskinesia:  no  Neuroimaging has previously been performed.  No films available.  Reported to be normal noncontrast MRI brain in 05/2013.  PREVIOUS MEDICATIONS: Mirapex  ALLERGIES:  No Known Allergies  CURRENT MEDICATIONS:  Current Outpatient Prescriptions on File Prior to Visit  Medication Sig Dispense Refill  . BENICAR 40 MG  tablet Take 1 tablet by mouth daily.      . cyclobenzaprine (FLEXERIL) 10 MG tablet Take 1 tablet (10 mg total) by mouth 2 (two) times daily as needed for muscle spasms.  20 tablet  0  . traMADol (ULTRAM) 50 MG tablet Take 1 tablet (50 mg total) by mouth every 6 (six) hours as needed.  15 tablet  0   No current facility-administered medications on file prior to visit.    PAST MEDICAL HISTORY:   Past Medical History  Diagnosis Date  . Hypertension   . Herniated disc   . Seasonal allergies   . Dizziness   . Tremor     hands  . Weight loss   . Fatigue   . DDD (degenerative disc disease), cervical   . Chronic back pain   . Myoclonus   .  Cognitive decline 09/2013  . Orthostatic hypotension     PAST SURGICAL HISTORY:   Past Surgical History  Procedure Laterality Date  . Back surgery  2012    lumbar    SOCIAL HISTORY:   History   Social History  . Marital Status: Married    Spouse Name: [name]    Number of Children: 3  . Years of Education: college   Occupational History  . CAD DESIGNER    Social History Main Topics  . Smoking status: Never Smoker   . Smokeless tobacco: Never Used  . Alcohol Use: 0.6 oz/week    1 Cans of beer per week     Comment: OCC  . Drug Use: No  . Sexual Activity: Not on file   Other Topics Concern  . 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 [name]   [employer].   Caffeine consumption is 1 cup a day          FAMILY HISTORY:   Family Status  Relation Status Death Age  . Mother Alive     HTN  . Father Alive     HTN  . Sister Alive     healthy  . Sister Alive     healthy  . Son Alive     healthy  . Son Alive     healthy  . Daughter Alive     autoimmune- burchette    ROS:  A complete 10 system review of systems was obtained and was unremarkable apart from what is mentioned above.  PHYSICAL EXAMINATION:    VITALS:   Filed Vitals:   02/12/14 0812  BP: 98/68  Pulse: 72  Height: 5\' 9"  (1.753 m)  Weight: 169 lb (76.658 kg)   Pt undressed and placed into examining shorts prior to examination.  GEN:  The patient appears stated age and is in NAD. HEENT:  Normocephalic, atraumatic.  The mucous membranes are moist. The superficial temporal arteries are without ropiness or tenderness. CV:  RRR Lungs:  CTAB Neck/HEME:  There are no carotid bruits bilaterally.  Neurological examination:  Orientation: The patient is alert and oriented x3. Fund of knowledge is appropriate.  Recent and remote memory are intact.  Attention and concentration are normal.    Able to name objects  and repeat phrases. Cranial nerves: There is good facial symmetry. Pupils are equal round and reactive to light bilaterally. Fundoscopic exam reveals clear margins bilaterally. Extraocular muscles are intact. The visual fields are full to confrontational testing. The speech is fluent and  clear. Soft palate rises symmetrically and there is no tongue deviation.  No tongue fasciculations.  Hearing is intact to conversational tone. Sensation: Sensation is intact to light and pinprick throughout (facial, trunk, extremities). Vibration is intact at the bilateral big toe. There is no extinction with double simultaneous stimulation.  Reports decreased pinprick over the S1/S2 distribution below the knee only on the right. Motor: Strength is 5/5 in the bilateral upper and lower extremities.   Shoulder shrug is equal and symmetric.  There is no pronator drift. No fasciculations. Deep tendon reflexes: Deep tendon reflexes are 2/4 at the bilateral biceps, triceps, brachioradialis, patella and achilles. Plantar responses are downgoing bilaterally.  Movement examination: Tone: There is normal tone in the bilateral upper extremities.  The tone in the lower extremities is normal.  Abnormal movements: none Coordination:  There is no decremation with RAM's Gait and Station: The patient has no difficulty arising out of a deep-seated chair without the use of the hands. The patient's stride length is normal.  The patient has a negative pull test.    When asked to walk down the hall at a normal pace there is decreased left arm swing and just momentary tremor of that hand but when asked to jog down the hall, the arm moved symmetrically from right to left and the jog was well paced.  He squats down and gets up without any difficulty.  He has a negative pull test.  He is able to stand in the Romberg position without any difficulty.  ASSESSMENT/PLAN:  1.  the patient brings in with him in the area of complaints including blood  pressure fluctuations, paresthesias and lightheadedness.  When asked what the single most bothersome thing is, the patient states lightheadedness with recent syncope.  I explained to him that I saw no evidence of a neurodegenerative process, specifically parkinsonism.  I do not think that he has MSA.  -His orthostatics were negative in the office today.  -He has had a rather extensive neurologic workup including a reported negative MRI of the brain and a negative EMG at a different neurologic practice.  -We will go ahead and do an MRI of the lumbar spine given that he reports paresthesias along a portion of the S1/S2 distribution on the right, only below the knee.  He has had a prior back surgery, so it is conceivable that he has some scar tissue formation, but this certainly does not explain all of his symptoms.  -It is my recommendation that perhaps a cardiology consultation be considered and perhaps there because some consideration for a tilt table test given the fact he does report some syncope and lightheadedness.  This is out of my field of expertise, so I will leave this to the discretion of his primary care physician, as I was just asked to comment on the parkinsonism, which I do not think that he has.  I did recommend that he not drive.  -Followup as needed.  We will call him with the results of the MRI of the lumbar spine.

## 2014-02-19 ENCOUNTER — Ambulatory Visit: Payer: Self-pay | Admitting: Neurology

## 2014-03-02 ENCOUNTER — Ambulatory Visit (HOSPITAL_COMMUNITY): Payer: BC Managed Care – PPO

## 2014-03-07 ENCOUNTER — Ambulatory Visit: Payer: Self-pay | Admitting: Neurology

## 2014-03-14 ENCOUNTER — Telehealth: Payer: Self-pay | Admitting: Cardiovascular Disease

## 2014-03-14 NOTE — Telephone Encounter (Signed)
The pt states that his PCP sent a cardiac referral over for him to be seen for his dizziness, which the pt states he has been having for 1 year, and dull exertional CP.  Will forward message back to scheduling to schedule.

## 2014-03-14 NOTE — Telephone Encounter (Signed)
New problem :    Pt has been having dizzy spells blacked out once and dull chest paint.   Pt's PCP faxed 7/6 office notes to this office.

## 2014-03-27 ENCOUNTER — Ambulatory Visit (INDEPENDENT_AMBULATORY_CARE_PROVIDER_SITE_OTHER): Payer: BC Managed Care – PPO | Admitting: Cardiology

## 2014-03-27 VITALS — BP 122/77 | HR 58 | Ht 69.0 in | Wt 170.0 lb

## 2014-03-27 DIAGNOSIS — R5383 Other fatigue: Secondary | ICD-10-CM

## 2014-03-27 DIAGNOSIS — R079 Chest pain, unspecified: Secondary | ICD-10-CM

## 2014-03-27 DIAGNOSIS — R5381 Other malaise: Secondary | ICD-10-CM

## 2014-03-27 DIAGNOSIS — R55 Syncope and collapse: Secondary | ICD-10-CM

## 2014-03-27 LAB — TSH: TSH: 1.8 u[IU]/mL (ref 0.350–4.500)

## 2014-03-27 LAB — BASIC METABOLIC PANEL WITH GFR
BUN: 16 mg/dL (ref 6–23)
CHLORIDE: 104 meq/L (ref 96–112)
CO2: 26 mEq/L (ref 19–32)
Calcium: 9.3 mg/dL (ref 8.4–10.5)
Creat: 1.25 mg/dL (ref 0.50–1.35)
GFR, EST NON AFRICAN AMERICAN: 69 mL/min
GFR, Est African American: 79 mL/min
Glucose, Bld: 93 mg/dL (ref 70–99)
POTASSIUM: 4.9 meq/L (ref 3.5–5.3)
SODIUM: 138 meq/L (ref 135–145)

## 2014-03-27 LAB — CBC
HEMATOCRIT: 40 % (ref 39.0–52.0)
Hemoglobin: 13.6 g/dL (ref 13.0–17.0)
MCH: 30.6 pg (ref 26.0–34.0)
MCHC: 34 g/dL (ref 30.0–36.0)
MCV: 90.1 fL (ref 78.0–100.0)
PLATELETS: 321 10*3/uL (ref 150–400)
RBC: 4.44 MIL/uL (ref 4.22–5.81)
RDW: 13.7 % (ref 11.5–15.5)
WBC: 7 10*3/uL (ref 4.0–10.5)

## 2014-03-27 NOTE — Patient Instructions (Signed)
Your physician recommends that you schedule a follow-up appointment in: 2 weeks  Wear event monitor 2 weeks  Have labs done today

## 2014-03-27 NOTE — Progress Notes (Signed)
Patient ID: Dustin Rogers, male   DOB: [birth year], 48 y.o.   MRN: [id number]    03/27/2014 Dustin Rogers   1966/01/07  [id number]  Primary Physicia [name], MD Primary Cardiologist: Dr. [name]  HPI:  The patient is a 48 y/o male, followed by Dr. [name], who presents to clinic today with complaints of intermittent dizziness, palpitations, syncope/near syncope, extreme fatigue and chest pain. In 2014, he underwent a stress echo to evaluate for chest pain that was a normal study. Per report, he exercised without chest pain. The stress EKG was normal. There was no echocardiographic evidence of stress induced ischemia.   Today in clinic, he appears very anxious. He states he has been under a great deal of stress over the last several months. He also reports that he has been seen in the emergency department at least 4 times in the past year with various complaints. In May of this year, he reports suffering a syncopal episode. It appeared suddenly without warning. It occurred in the morning after getting out of bed. He did not seek emergency evaluation at that time. He denies any further recurrence since then. He has however experienced frequent episodes of dizziness and near syncope as well as frequent palpitations. He denies excessive caffeine use and no alcohol use. Two weeks ago while playing basketball, he also experienced substernal chest discomfort described as a dull ache, that was nonradiating. It was relieved after resting. However, he also frequently bikes and walks for exercise without any chest discomfort/limitations. He also complains of frequent fatigue and feeling tired. He is followed medically by Dr. [name]. The only prescription medication he takes is 20 mg of Benicar. He also takes an over-the-counter multivitamin.    Current Outpatient Prescriptions  Medication Sig Dispense Refill  . BENICAR 40 MG tablet Take 1 tablet by mouth daily.       No current facility-administered  medications for this visit.    No Known Allergies  History   Social History  . Marital Status: Married    Spouse Name: [name]    Number of Children: 3  . Years of Education: college   Occupational History  . CAD DESIGNER     Restaurant manager, fast food   Social History Main Topics  . Smoking status: Never Smoker   . Smokeless tobacco: Never Used  . Alcohol Use: 0.6 oz/week    1 Cans of beer per week     Comment: OCC  . Drug Use: No  . Sexual Activity: Not on file   Other Topics Concern  . 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 [name]   [employer].   Caffeine consumption is 1 cup a day           Review of Systems: General: negative for chills, fever, night sweats or weight changes.  Cardiovascular: negative for chest pain, dyspnea on exertion, edema, orthopnea, palpitations, paroxysmal nocturnal dyspnea or shortness of breath Dermatological: negative for rash Respiratory: negative for cough or wheezing Urologic: negative for hematuria Abdominal: negative for nausea, vomiting, diarrhea, bright red blood per rectum, melena, or hematemesis Neurologic: negative for visual changes, syncope, or dizziness All other systems reviewed and are otherwise negative except as noted above.    Blood pressure 122/77, pulse 58, height 5\' 9"  (1.753 m), weight 170 lb (77.111 kg).  General appearance: alert, cooperative and no distress Neck: no carotid bruit and  no JVD Lungs: clear to auscultation bilaterally Heart: regular rate and rhythm, S1, S2 normal, no murmur, click, rub or gallop Extremities: no LEE Pulses: 2+ and symmetric Skin: warm and dry Neurologic: Grossly normal  EKG NSR 58 bpm  ASSESSMENT AND PLAN:   Dustin Rogers presents with a number of complaints including dizziness, palpitations, syncope/near-syncope, fatigue as well as occasional chest discomfort. He has a history of a  normal stress echo in November 2014. His EKG today demonstrates normal sinus rhythm with a ventricular rate of 58 beats per minute. Orthostatic vitals check revealed that he is not orthostatic. He also appears very anxious and admits to a lot of recent stressors in his life. This could very well be contributing to his symptoms, however we will further evaluate to rule out other potential etiologies. Will order a 2 week event monitor to assess for any abnormal cardiac rhythms that can explain his recent symptoms. We will also check a CBC to rule out anemia and TSH to assess thyroid function. We'll also check a BMET to assess renal function and electrolytes. He has been instructed to followup in 2 weeks for repeat assessment and to review his lab results and telemetry findings.   SIMMONS, BRITTAINYPA-C 03/27/2014 9:17 AM

## 2014-03-28 ENCOUNTER — Encounter: Payer: Self-pay | Admitting: Cardiology

## 2014-04-11 ENCOUNTER — Encounter: Payer: Self-pay | Admitting: Cardiology

## 2014-04-11 ENCOUNTER — Ambulatory Visit (INDEPENDENT_AMBULATORY_CARE_PROVIDER_SITE_OTHER): Payer: BC Managed Care – PPO | Admitting: Cardiology

## 2014-04-11 VITALS — BP 121/71 | HR 86 | Ht 69.0 in | Wt 171.0 lb

## 2014-04-11 DIAGNOSIS — R002 Palpitations: Secondary | ICD-10-CM

## 2014-04-11 NOTE — Patient Instructions (Signed)
Your physician recommends that you schedule a follow-up appointment in: as needed  

## 2014-04-11 NOTE — Progress Notes (Signed)
Patient ID: Dustin Rogers, male   DOB: 07/03/1966, 48 y.o.   MRN: [id number]    04/11/2014 Dustin LombardRichie A [place]   07/03/1966  [id number]  Primary Physician Leanor RubensteinSUN,VYVYAN Y, MD Primary Cardiologist: Dr. Eden EmmsNishan  HPI:  The patient is a 48 yo male who presents in follow-up from his recent office visit. He previously noted a variety of symptoms which included intermittent dizziness, palpitations, syncope/near syncope, fatigue, and chest discomfort. Today he reports that these issues have resolved. He notes no issues since his last visit. He states his dizzy spells have not been an issue over the past month. He attributes his symptoms to the significant stress he has been under over the past year. He notes that he has turned to "my pool of people" for guidance on this and has turned to his faith as well. He notes significant improvement since doing these things. He notes he is learning to deal with his stress in a better manner. He denies chest pain, dyspnea, dizziness, and syncope. He does note an increase in HR with anxiety, though no issues with this otherwise.  He does continue to note some fatigue that is an issue some days of the week. This has only been an issue since he has been under stress. He endorses some depressed feelings and decreased enjoyment of activities, though he is now trying to get back in to his activities and wants to start exercising. His appetite has not changed. His jitteriness has improved. His concentration is ok. He notes lots of thoughts running through his mind. He does note feeling of guilt. No SI or HI.  At the patients last visit he was placed on a 2 week heart monitor that identified one episode of sinus tachycardia and was otherwise normal. His blood work did not reveal any abnormalities. Note patient had a normal stress echo in November 2014. CBC was negative for anemia and TSH was WNL.    Current Outpatient Prescriptions  Medication Sig Dispense Refill  . BENICAR 40 MG  tablet Take 1 tablet by mouth daily.       No current facility-administered medications for this visit.    No Known Allergies  History   Social History  . Marital Status: Married    Spouse Name: Dustin BattenKim    Number of Children: 3  . Years of Education: college   Occupational History  . CAD DESIGNER     Restaurant manager, fast foodengineer designer   Social History Main Topics  . Smoking status: Never Smoker   . Smokeless tobacco: Never Used  . Alcohol Use: 0.6 oz/week    1 Cans of beer per week     Comment: OCC  . Drug Use: No  . Sexual Activity: Not on file   Other Topics Concern  . Not on file   Social History Narrative   Patient is a Public librarianarchitectural designer. Patient is married Dustin Rogers(Dustin Rogers)  and works full time. [employer]   [employer] education.   Caffeine consumption is 1 cup a day           Review of Systems: Per HPI.    Blood pressure 121/71, pulse 86, height 5\' 9"  (1.753 m), weight 171 lb (77.565 kg).  General appearance: alert, cooperative and no distress Neck: no carotid bruit and no JVD Lungs: clear to auscultation bilaterally Heart: regular rate and rhythm, S1, S2 normal, no murmur, click, rub or gallop Extremities: no LEE Pulses: 2+ and symmetric Skin: warm and dry Neurologic: Grossly normal   ASSESSMENT AND  PLAN:   Mr. Beamer presents with resolution of the majority of his previous complaints. He has a history of normal stress echo in November 2014 and his heart monitor over the past 2 weeks has been unremarkable. His lab work up for fatigue was unremarkable as well. He does not appear to have a cardiac etiology for these symptoms given his negative work-up. It would appear that his symptoms are most likely related to depression and anxiety. The patient has developed a good support group around him that he is able to discuss his stressors with. He has good insight in to the cause of his symptoms and has come up with exercise as a strategy to help with this issue. We discussed his good  coping strategies and other options of treatment, including anti-depressants and therapy, for his anxiety and depression, though the patient feels as though he is improving with his current strategies. He was advised to seek medical attention if there is worsening of his symptoms. Patient with no need for cardiac follow-up at this time and will follow-up with his PCP regarding this issue. F/u will be prn with cardiology.  [name], MD [facility] PGY-3  I interviewed and examined the patient along with Dr. Birdie Sons and agree with the above assessment and plan. Recent w/u including stress echo, cardiac event monitor and blood work have all been unremarkable. We feel that the cause of his symptoms are related to anxiety. His symptoms have improved remarkably with better control of his stress through copping strategies. He will continue to f/u with his PCP for management of anxiety. No further cardiac w/u needed. F/u PRN.   [name], PA-C

## 2014-06-22 ENCOUNTER — Other Ambulatory Visit: Payer: Self-pay

## 2014-10-28 IMAGING — CR DG CHEST 2V
2 series · 2 of 2 positions shown · non-contrast
Comparison: 05/03/2013

CLINICAL DATA: Left chest pain, palpitations

EXAM:
CHEST  2 VIEW

[w chest pa]
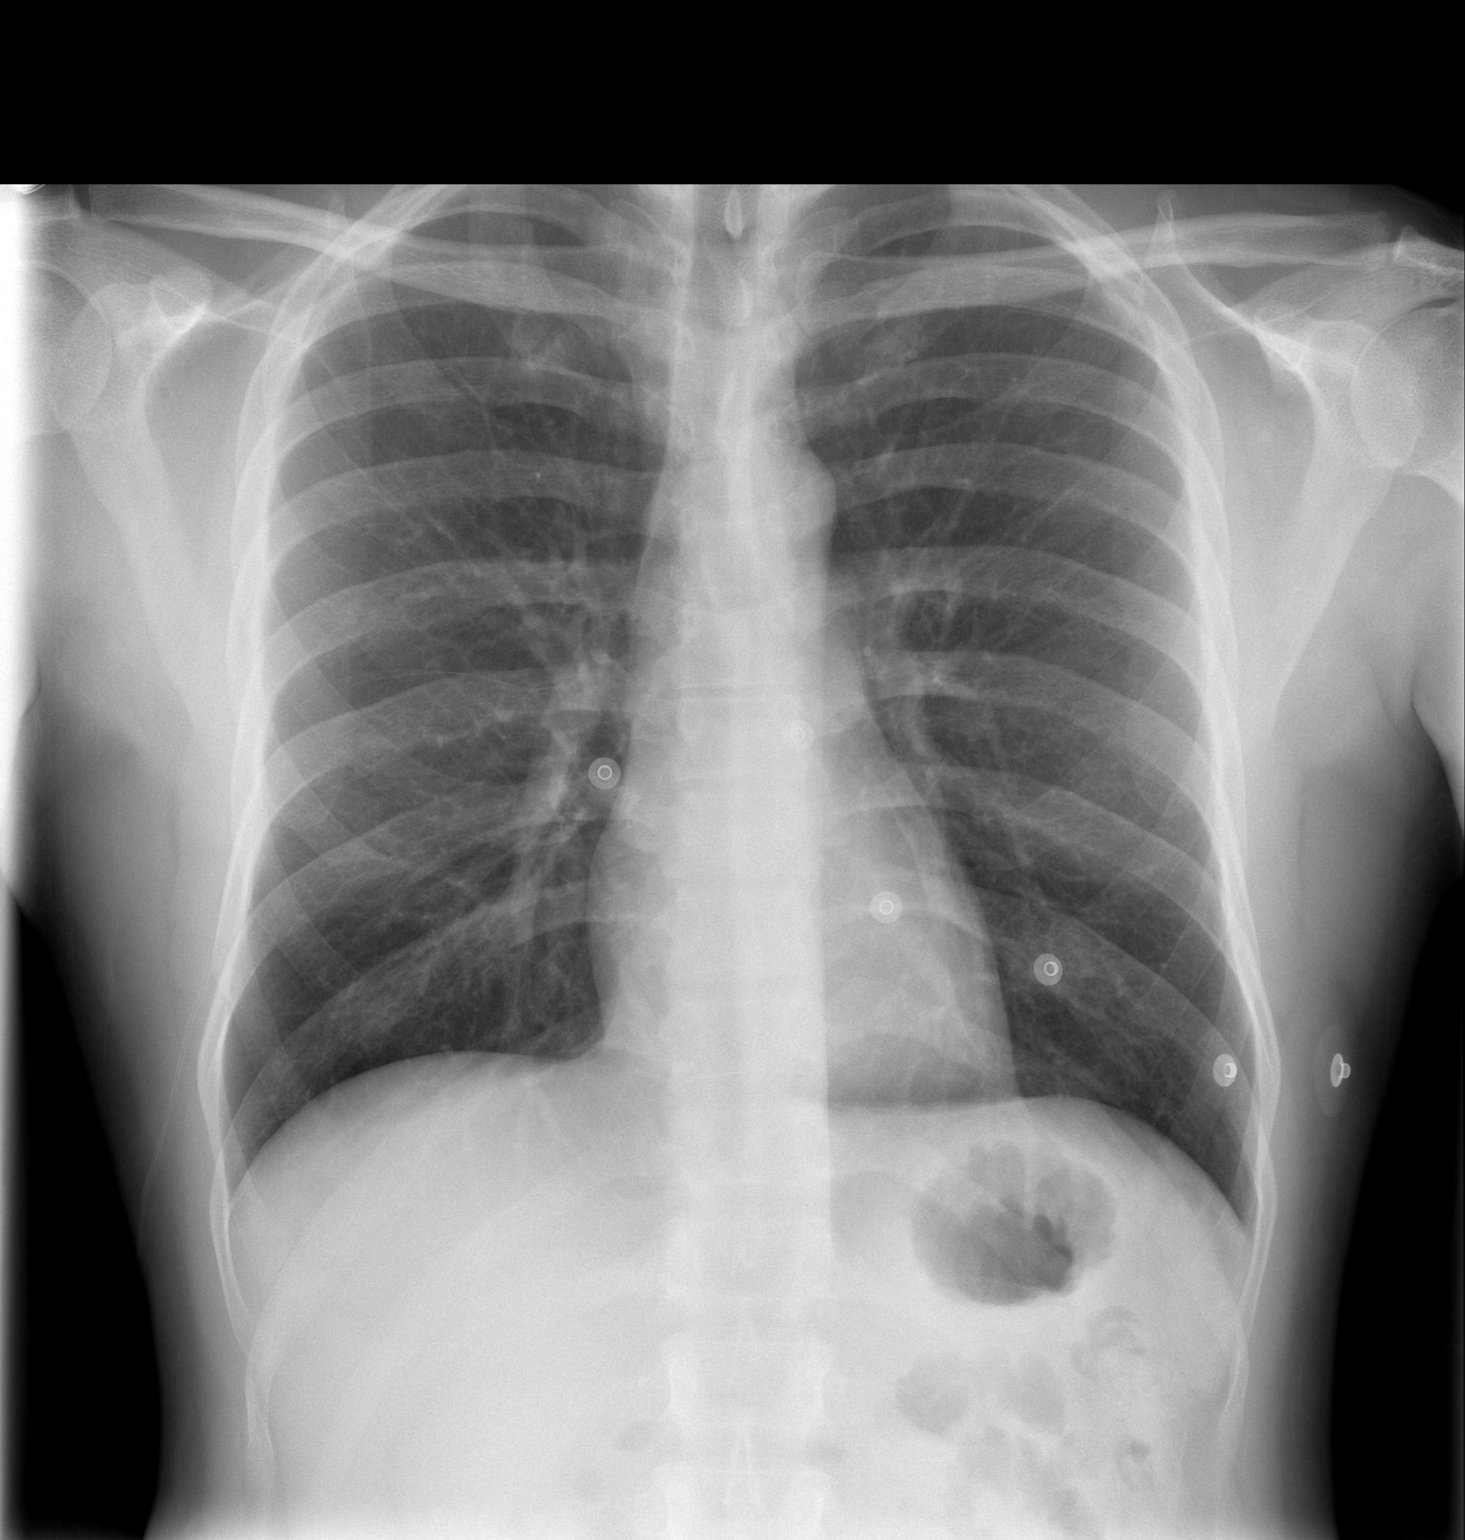

[w chest lat]
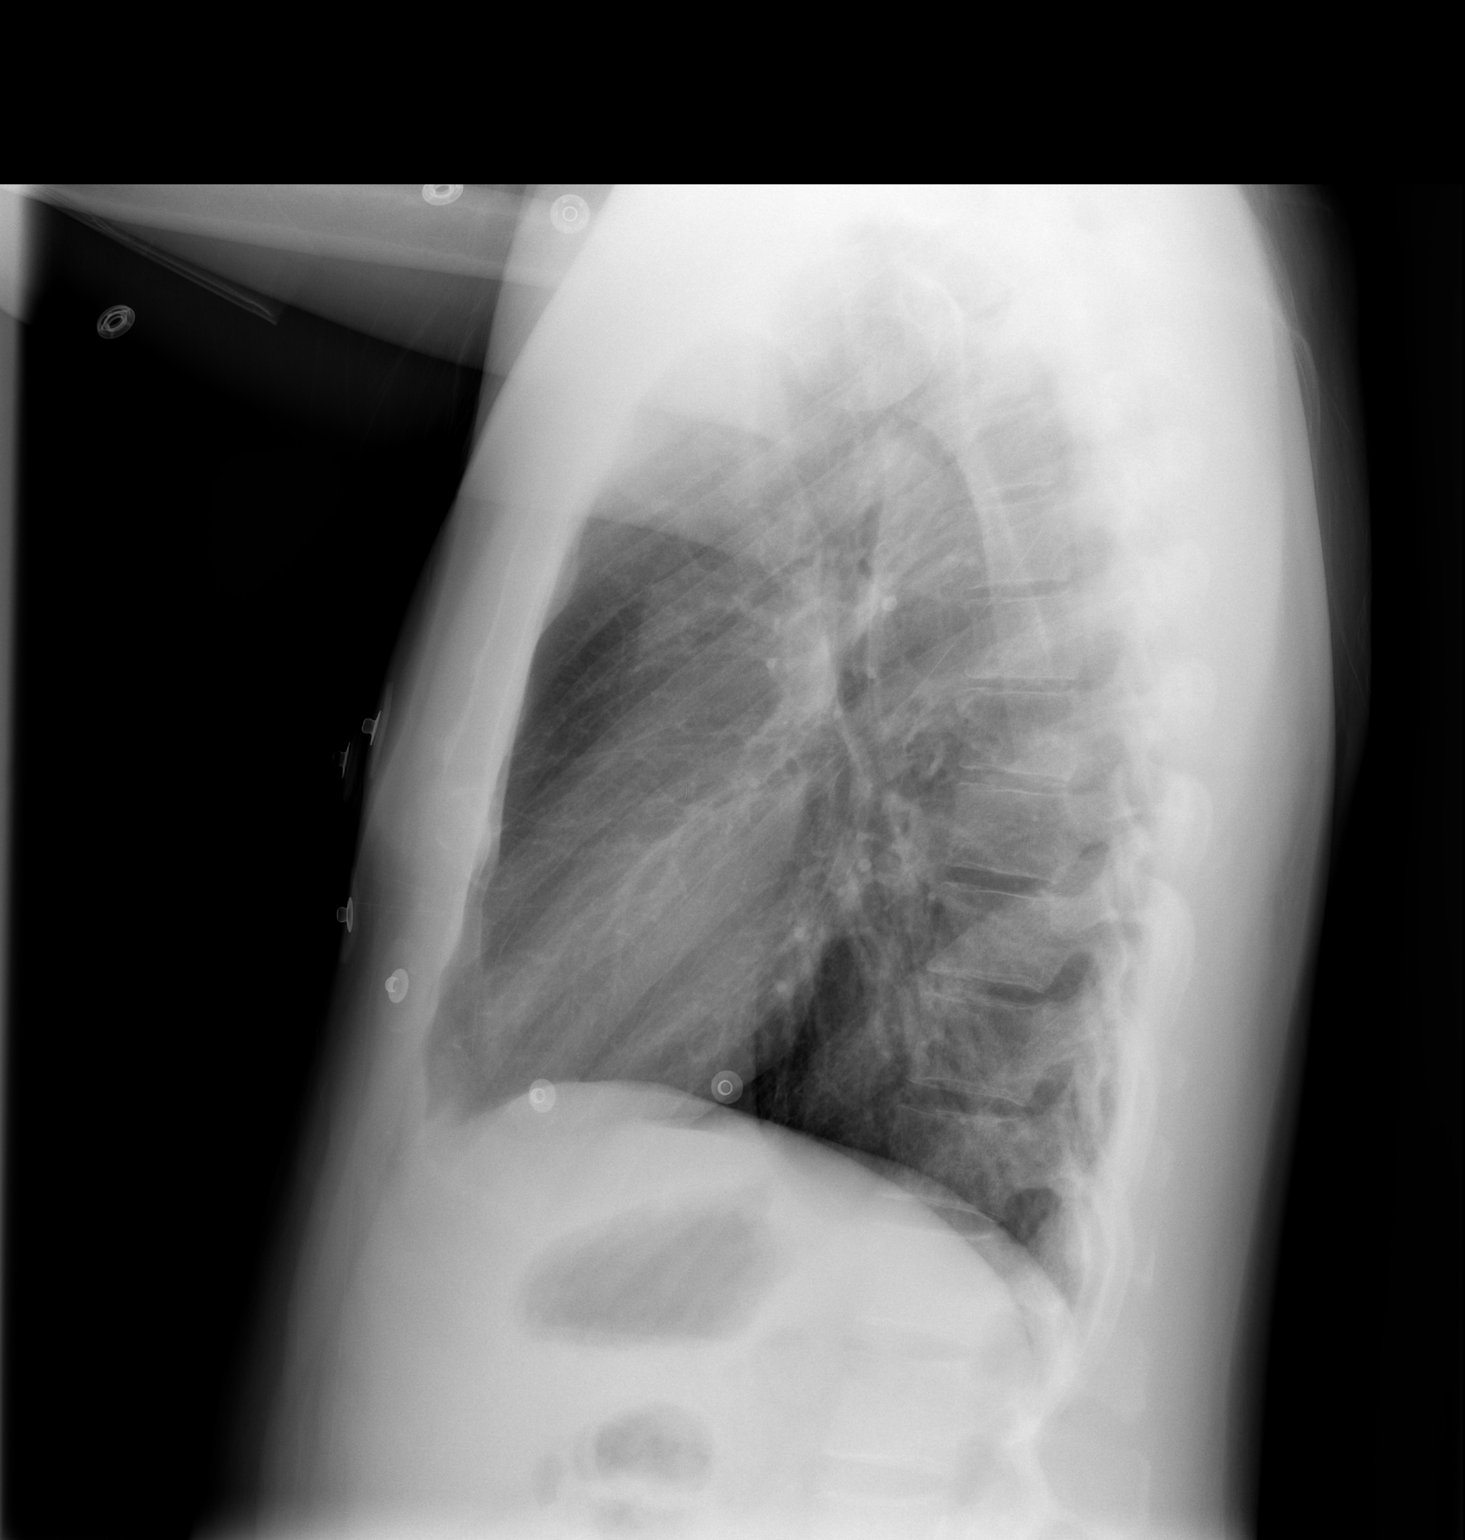

[2 of 2 positions shown; findings below may reference images not displayed]

FINDINGS: Lungs are clear. No pleural effusion or pneumothorax.

The heart is normal in size.

Visualized osseous structures are within normal limits.
IMPRESSION: Normal chest radiographs.

## 2014-11-09 ENCOUNTER — Telehealth: Payer: Self-pay | Admitting: Cardiology

## 2014-11-09 NOTE — Telephone Encounter (Signed)
Pt has PCP appt pending but they will not prescribe him anything for sleep until he has been seen in office.  He has tried melatonin w/ no improvement of symptoms.  Will defer to [place] - pt states sleep issues were discussed at last OV in August and he was advised them we could prescribe something for sleep if needed.

## 2014-11-09 NOTE — Telephone Encounter (Signed)
Pt would like something to help him sleep please.

## 2016-02-05 DIAGNOSIS — I10 Essential (primary) hypertension: Secondary | ICD-10-CM | POA: Diagnosis not present

## 2016-02-05 DIAGNOSIS — M3500 Sicca syndrome, unspecified: Secondary | ICD-10-CM | POA: Diagnosis not present

## 2016-02-25 DIAGNOSIS — R600 Localized edema: Secondary | ICD-10-CM | POA: Diagnosis not present

## 2016-03-02 DIAGNOSIS — M545 Low back pain: Secondary | ICD-10-CM | POA: Diagnosis not present

## 2016-03-02 DIAGNOSIS — R5382 Chronic fatigue, unspecified: Secondary | ICD-10-CM | POA: Diagnosis not present

## 2016-03-02 DIAGNOSIS — M3500 Sicca syndrome, unspecified: Secondary | ICD-10-CM | POA: Diagnosis not present

## 2016-03-02 DIAGNOSIS — F5104 Psychophysiologic insomnia: Secondary | ICD-10-CM | POA: Diagnosis not present

## 2016-03-17 DIAGNOSIS — R7301 Impaired fasting glucose: Secondary | ICD-10-CM | POA: Diagnosis not present

## 2016-03-17 DIAGNOSIS — Z125 Encounter for screening for malignant neoplasm of prostate: Secondary | ICD-10-CM | POA: Diagnosis not present

## 2016-03-17 DIAGNOSIS — Z0000 Encounter for general adult medical examination without abnormal findings: Secondary | ICD-10-CM | POA: Diagnosis not present

## 2016-03-17 DIAGNOSIS — I10 Essential (primary) hypertension: Secondary | ICD-10-CM | POA: Diagnosis not present

## 2016-08-26 DIAGNOSIS — R600 Localized edema: Secondary | ICD-10-CM | POA: Diagnosis not present

## 2016-09-18 DIAGNOSIS — I10 Essential (primary) hypertension: Secondary | ICD-10-CM | POA: Diagnosis not present

## 2016-09-18 DIAGNOSIS — N529 Male erectile dysfunction, unspecified: Secondary | ICD-10-CM | POA: Diagnosis not present

## 2016-12-04 DIAGNOSIS — H16223 Keratoconjunctivitis sicca, not specified as Sjogren's, bilateral: Secondary | ICD-10-CM | POA: Diagnosis not present

## 2016-12-04 DIAGNOSIS — H2513 Age-related nuclear cataract, bilateral: Secondary | ICD-10-CM | POA: Diagnosis not present

## 2016-12-04 DIAGNOSIS — H04123 Dry eye syndrome of bilateral lacrimal glands: Secondary | ICD-10-CM | POA: Diagnosis not present

## 2016-12-04 DIAGNOSIS — R7303 Prediabetes: Secondary | ICD-10-CM | POA: Diagnosis not present

## 2017-03-29 DIAGNOSIS — R7303 Prediabetes: Secondary | ICD-10-CM | POA: Diagnosis not present

## 2017-03-29 DIAGNOSIS — N529 Male erectile dysfunction, unspecified: Secondary | ICD-10-CM | POA: Diagnosis not present

## 2017-03-29 DIAGNOSIS — I10 Essential (primary) hypertension: Secondary | ICD-10-CM | POA: Diagnosis not present

## 2017-03-29 DIAGNOSIS — Z0000 Encounter for general adult medical examination without abnormal findings: Secondary | ICD-10-CM | POA: Diagnosis not present

## 2017-03-29 DIAGNOSIS — Z23 Encounter for immunization: Secondary | ICD-10-CM | POA: Diagnosis not present

## 2017-03-29 DIAGNOSIS — B353 Tinea pedis: Secondary | ICD-10-CM | POA: Diagnosis not present

## 2017-09-29 DIAGNOSIS — N529 Male erectile dysfunction, unspecified: Secondary | ICD-10-CM | POA: Diagnosis not present

## 2017-09-29 DIAGNOSIS — J309 Allergic rhinitis, unspecified: Secondary | ICD-10-CM | POA: Diagnosis not present

## 2017-09-29 DIAGNOSIS — R7303 Prediabetes: Secondary | ICD-10-CM | POA: Diagnosis not present

## 2017-09-29 DIAGNOSIS — I10 Essential (primary) hypertension: Secondary | ICD-10-CM | POA: Diagnosis not present

## 2017-12-09 DIAGNOSIS — H25013 Cortical age-related cataract, bilateral: Secondary | ICD-10-CM | POA: Diagnosis not present

## 2017-12-09 DIAGNOSIS — H16223 Keratoconjunctivitis sicca, not specified as Sjogren's, bilateral: Secondary | ICD-10-CM | POA: Diagnosis not present

## 2017-12-09 DIAGNOSIS — H2513 Age-related nuclear cataract, bilateral: Secondary | ICD-10-CM | POA: Diagnosis not present

## 2018-04-01 DIAGNOSIS — N529 Male erectile dysfunction, unspecified: Secondary | ICD-10-CM | POA: Diagnosis not present

## 2018-04-01 DIAGNOSIS — Z125 Encounter for screening for malignant neoplasm of prostate: Secondary | ICD-10-CM | POA: Diagnosis not present

## 2018-04-01 DIAGNOSIS — I10 Essential (primary) hypertension: Secondary | ICD-10-CM | POA: Diagnosis not present

## 2018-04-01 DIAGNOSIS — E119 Type 2 diabetes mellitus without complications: Secondary | ICD-10-CM | POA: Diagnosis not present

## 2018-04-01 DIAGNOSIS — Z0000 Encounter for general adult medical examination without abnormal findings: Secondary | ICD-10-CM | POA: Diagnosis not present

## 2018-04-18 DIAGNOSIS — R740 Nonspecific elevation of levels of transaminase and lactic acid dehydrogenase [LDH]: Secondary | ICD-10-CM | POA: Diagnosis not present

## 2018-06-08 DIAGNOSIS — Z1211 Encounter for screening for malignant neoplasm of colon: Secondary | ICD-10-CM | POA: Diagnosis not present

## 2018-07-21 ENCOUNTER — Ambulatory Visit (INDEPENDENT_AMBULATORY_CARE_PROVIDER_SITE_OTHER): Payer: BLUE CROSS/BLUE SHIELD | Admitting: Allergy

## 2018-07-21 ENCOUNTER — Encounter: Payer: Self-pay | Admitting: Allergy

## 2018-07-21 VITALS — BP 124/70 | HR 68 | Temp 97.8°F | Resp 16 | Ht 69.0 in | Wt 197.0 lb

## 2018-07-21 DIAGNOSIS — J3089 Other allergic rhinitis: Secondary | ICD-10-CM

## 2018-07-21 MED ORDER — FLUTICASONE PROPIONATE 50 MCG/ACT NA SUSP: 2.0000 | Freq: Every day | NASAL | 2 refills | Status: AC

## 2018-07-21 MED ORDER — MONTELUKAST SODIUM 10 MG PO TABS: 10.0000 mg | ORAL_TABLET | Freq: Every day | ORAL | 2 refills | Status: DC

## 2018-07-21 NOTE — Assessment & Plan Note (Addendum)
Perennial rhinitis symptoms with significant snoring for the past 15 years.  Tried Claritin-D and Sudafed with no benefit.  No previous work-up or ENT evaluation.  Today's testing showed: positive to dust mites and some molds.  Discussed environmental control measures.   Start Flonase 2 sprays once a day.  Demonstrated proper use.  Start Singulair 10mg  daily.   May use nasal saline spray as needed.  If not improved with above regimen then will refer to ENT for further evaluation.

## 2018-07-21 NOTE — Patient Instructions (Addendum)
Other allergic rhinitis Perennial rhinitis symptoms with significant snoring for the past 15 years.  Tried Claritin-D and Sudafed with no benefit.  No previous work-up or ENT evaluation.  Today's testing showed: positive to dust mites and some molds.  Discussed environmental control measures.   Start Flonase 2 sprays once a day.  Demonstrated proper use.  Start Singulair 10mg  daily.   May use nasal saline spray as needed.  If not improved with above regimen then will refer to ENT for further evaluation.   Return in about 2 months (around 09/20/2018).   Control of House Dust Mite Allergen . Dust mite allergens are a common trigger of allergy and asthma symptoms. While they can be found throughout the house, these microscopic creatures thrive in warm, humid environments such as bedding, upholstered furniture and carpeting. . Because so much time is spent in the bedroom, it is essential to reduce mite levels there.  . Encase pillows, mattresses, and box springs in special allergen-proof fabric covers or airtight, zippered plastic covers.  . Bedding should be washed weekly in hot water (130 F) and dried in a hot dryer. Allergen-proof covers are available for comforters and pillows that can't be regularly washed.  Reyes Ivan the allergy-proof covers every few months. Minimize clutter in the bedroom. Keep pets out of the bedroom.  Marland Kitchen Keep humidity less than 50% by using a dehumidifier or air conditioning. You can buy a humidity measuring device called a hygrometer to monitor this.  . If possible, replace carpets with hardwood, linoleum, or washable area rugs. If that's not possible, vacuum frequently with a vacuum that has a HEPA filter. . Remove all upholstered furniture and non-washable window drapes from the bedroom. . Remove all non-washable stuffed toys from the bedroom.  Wash stuffed toys weekly. .  Mold Control . Mold and fungi can grow on a variety of surfaces provided certain temperature  and moisture conditions exist.  . Outdoor molds grow on plants, decaying vegetation and soil. The major outdoor mold, Alternaria and Cladosporium, are found in very high numbers during hot and dry conditions. Generally, a late summer - fall peak is seen for common outdoor fungal spores. Rain will temporarily lower outdoor mold spore count, but counts rise rapidly when the rainy period ends. . The most important indoor molds are Aspergillus and Penicillium. Dark, humid and poorly ventilated basements are ideal sites for mold growth. The next most common sites of mold growth are the bathroom and the kitchen. Outdoor (Seasonal) Mold Control . Use air conditioning and keep windows closed. . Avoid exposure to decaying vegetation. Marland Kitchen Avoid leaf raking. . Avoid grain handling. . Consider wearing a face mask if working in moldy areas.  Indoor (Perennial) Mold Control  . Maintain humidity below 50%. . Get rid of mold growth on hard surfaces with water, detergent and, if necessary, 5% bleach (do not mix with other cleaners). Then dry the area completely. If mold covers an area more than 10 square feet, consider hiring an indoor environmental professional. . For clothing, washing with soap and water is best. If moldy items cannot be cleaned and dried, throw them away. . Remove sources e.g. contaminated carpets. . Repair and seal leaking roofs or pipes. Using dehumidifiers in damp basements may be helpful, but empty the water and clean units regularly to prevent mildew from forming. All rooms, especially basements, bathrooms and kitchens, require ventilation and cleaning to deter mold and mildew growth. Avoid carpeting on concrete or damp floors, and storing  items in damp areas.

## 2018-07-21 NOTE — Progress Notes (Signed)
New Patient Note  RE: Dustin Rogers MRN: [id number] DOB: [birth year] Date of Office Visit: 07/21/2018  Referring provider: Graylin Shiver, MD Primary care provider: Deatra James, MD  Chief Complaint: Allergies; Nasal Congestion; and Sinusitis  History of Present Illness: I had the pleasure of seeing Dustin Rogers for initial evaluation at the [facility] on 07/21/2018. He is a 52 y.o. male, who is referred here by Deatra James, MD for the evaluation of sinus issues.   He reports symptoms of nasal congestion, PND, snoring, coughing up yellow mucous in the morning only, itchy eyes. Denies any other respiratory symptoms. Symptoms have been going on for 15 years. The symptoms are present all year around. Other triggers include exposure to unsure. Anosmia: no. Headache: no. He has used Claritin-D, sudafed with no improvement in symptoms. Sinus infections: none. Previous work up includes: none. No sleep study done. Previous ENT evaluation: no. Previous sinus imaging: no.  Assessment and Plan: Dustin Rogers is a 52 y.o. male with: Other allergic rhinitis Perennial rhinitis symptoms with significant snoring for the past 15 years.  Tried Claritin-D and Sudafed with no benefit.  No previous work-up or ENT evaluation.  Today's testing showed: positive to dust mites and some molds.  Discussed environmental control measures.   Start Flonase 2 sprays once a day.  Demonstrated proper use.  Start Singulair 10mg  daily.   May use nasal saline spray as needed.  If not improved with above regimen then will refer to ENT for further evaluation.   Return in about 2 months (around 09/20/2018).  Meds ordered this encounter  Medications  . fluticasone (FLONASE) 50 MCG/ACT nasal spray    Sig: Place 2 sprays into both nostrils daily.    Dispense:  16 g    Refill:  2  . montelukast (SINGULAIR) 10 MG tablet    Sig: Take 1 tablet (10 mg total) by mouth at bedtime.    Dispense:  30 tablet    Refill:  2   Other allergy screening: Asthma: no Rhino conjunctivitis: yes Food allergy: no Medication allergy: no Hymenoptera allergy: no Urticaria: no Eczema:no History of recurrent infections suggestive of immunodeficency: no  Diagnostics: Skin Testing: Environmental allergy panel. Positive test to: dust mites and few molds as below. Results discussed with patient/family. Airborne Adult Perc - 07/21/18 1013    Time Antigen Placed  1013    Allergen Manufacturer  Greer    Location  Back    Number of Test  59    Panel 1  Select    1. Control-Buffer 50% Glycerol  Negative    2. Control-Histamine 1 mg/ml  3+    3. Albumin saline  Negative    4. Bahia  Negative    5. French Southern Territories  Negative    6. Johnson  Negative    7. Kentucky Blue  Negative    8. Meadow Fescue  Negative    9. Perennial Rye  Negative    10. Sweet Vernal  Negative    11. Timothy  Negative    12. Cocklebur  Negative    13. Burweed Marshelder  Negative    14. Ragweed, short  Negative    15. Ragweed, Giant  Negative    16. Plantain,  English  Negative    17. Lamb's Quarters  Negative    18. Sheep Sorrell  Negative    19. Rough Pigweed  Negative    20. Michail Jewels Elder, Rough  Negative  21. Mugwort, Common  Negative    22. Ash mix  Negative    23. Birch mix  Negative    24. Beech American  Negative    25. Box, Elder  Negative    26. Cedar, red  Negative    27. Cottonwood, Guinea-Bissau  Negative    28. Elm mix  Negative    29. Hickory mix  Negative    30. Maple mix  Negative    31. Oak, Guinea-Bissau mix  Negative    32. Pecan Pollen  Negative    33. Pine mix  Negative    34. Sycamore Eastern  Negative    35. Walnut, Black Pollen  Negative    36. Alternaria alternata  Negative    37. Cladosporium Herbarum  Negative    38. Aspergillus mix  Negative    39. Penicillium mix  Negative    40. Bipolaris sorokiniana (Helminthosporium)  Negative    41. Drechslera spicifera (Curvularia)  Negative    42. Mucor plumbeus   Negative    43. Fusarium moniliforme  Negative    44. Aureobasidium pullulans (pullulara)  Negative    45. Rhizopus oryzae  Negative    46. Botrytis cinera  Negative    47. Epicoccum nigrum  Negative    48. Phoma betae  Negative    49. Candida Albicans  Negative    50. Trichophyton mentagrophytes  Negative    51. Mite, D Farinae  5,000 AU/ml  Negative    52. Mite, D Pteronyssinus  5,000 AU/ml  Negative    53. Cat Hair 10,000 BAU/ml  Negative    54.  Dog Epithelia  Negative    55. Mixed Feathers  Negative    56. Horse Epithelia  Negative    57. Cockroach, German  Negative    58. Mouse  Negative    59. Tobacco Leaf  Negative     Intradermal - 07/21/18 1044    Time Antigen Placed  1100    Allergen Manufacturer  Greer    Location  Back    Number of Test  15    Intradermal  Select    Control  Negative    French Southern Territories  Negative    Johnson  Negative    7 Grass  Negative    Ragweed mix  Negative    Weed mix  Negative    Tree mix  Negative    Mold 1  Negative    Mold 2  2+    Mold 3  Negative    Mold 4  Negative    Cat  Negative    Dog  Negative    Cockroach  Negative    Mite mix  2+       Past Medical History: Patient Active Problem List   Diagnosis Date Noted  . Other allergic rhinitis 07/21/2018  . Palpitations 06/16/2013  . Chest pain 06/16/2013  . Orthostatic hypotension 06/08/2013  . Tremor 05/23/2013  . Hypertension   . Seasonal allergies   . Dizziness   . Fatigue    Past Medical History:  Diagnosis Date  . Chronic back pain   . Cognitive decline 09/2013  . DDD (degenerative disc disease), cervical   . Dizziness   . Fatigue   . Herniated disc   . Hypertension   . Myoclonus   . Orthostatic hypotension   . Seasonal allergies   . Tremor    hands  . Weight loss    Past Surgical  History: Past Surgical History:  Procedure Laterality Date  . BACK SURGERY  2012   lumbar   Medication List:  Current Outpatient Medications  Medication Sig Dispense Refill    . BENICAR 40 MG tablet Take 1 tablet by mouth daily.    Marland Kitchen loratadine-pseudoephedrine (CLARITIN-D 24-HOUR) 10-240 MG 24 hr tablet Take 1 tablet by mouth daily.    . Multiple Vitamin (MULTIVITAMIN) tablet Take 2 tablets by mouth daily.    . tadalafil (ADCIRCA/CIALIS) 20 MG tablet Take 20 mg by mouth daily as needed for erectile dysfunction.    . fluticasone (FLONASE) 50 MCG/ACT nasal spray Place 2 sprays into both nostrils daily. 16 g 2  . montelukast (SINGULAIR) 10 MG tablet Take 1 tablet (10 mg total) by mouth at bedtime. 30 tablet 2   No current facility-administered medications for this visit.    Allergies: No Known Allergies Social History: Social History   Socioeconomic History  . Marital status: Married    Spouse name: [name]  . Number of children: 3  . Years of education: college  . Highest education level: Not on file  Occupational History  . Occupation: CAD DESIGNER    Employer: [employer]    Comment: Restaurant manager, fast food  Social Needs  . Financial resource strain: Not on file  . Food insecurity:    Worry: Not on file    Inability: Not on file  . Transportation needs:    Medical: Not on file    Non-medical: Not on file  Tobacco Use  . Smoking status: Current Some Day Smoker    Types: Cigars  . Smokeless tobacco: Never Used  . Tobacco comment: 2 cigars per week   Substance and Sexual Activity  . Alcohol use: Yes    Alcohol/week: 1.0 standard drinks    Types: 1 Cans of beer per week    Comment: OCC  . Drug use: No  . Sexual activity: Not on file  Lifestyle  . Physical activity:    Days per week: Not on file    Minutes per session: Not on file  . Stress: Not on file  Relationships  . Social connections:    Talks on phone: Not on file    Gets together: Not on file    Attends religious service: Not on file    Active member of club or organization: Not on file    Attends meetings of clubs or organizations: Not on file    Relationship status: Not on file  Other  Topics Concern  . Not on file  Social History Narrative   Patient is a Public librarian. Patient is married [name])  and works full time. [name]   [employer].   Caffeine consumption is 1 cup a day         Lives in a 50 year old house. Smoking: occasional cigars Occupation: Publishing rights manager HistorySurveyor, minerals in the house: no Engineer, civil (consulting) in the family room: no Carpet in the bedroom: yes Heating: gas Cooling: central Pet: yes 1 dog x 1 yr and 3 cat x many years  Family History: Family History  Problem Relation Age of Onset  . High blood pressure Mother   . High blood pressure Father   . Liver cancer Paternal Grandfather    Problem                               Relation Asthma  No                             Eczema                                No Food allergy                          No Allergic rhino conjunctivitis     No  Review of Systems  Constitutional: Negative for appetite change, chills, fever and unexpected weight change.  HENT: Positive for congestion and postnasal drip. Negative for rhinorrhea.   Eyes: Negative for itching.  Respiratory: Positive for cough. Negative for chest tightness, shortness of breath and wheezing.   Cardiovascular: Negative for chest pain.  Gastrointestinal: Negative for abdominal pain.  Genitourinary: Negative for difficulty urinating.  Skin: Negative for rash.  Allergic/Immunologic: Negative for environmental allergies and food allergies.  Neurological: Negative for headaches.   Objective: BP 124/70 (BP Location: Right Arm, Patient Position: Sitting, Cuff Size: Normal)   Pulse 68   Temp 97.8 F (36.6 C) (Oral)   Resp 16   Ht 5\' 9"  (1.753 m)   Wt 197 lb (89.4 kg)   SpO2 98%   BMI 29.09 kg/m  Body mass index is 29.09 kg/m. Physical Exam  Constitutional: He is oriented to person, place, and time. He appears well-developed and well-nourished.  HENT:  Head: Normocephalic and atraumatic.   Right Ear: External ear normal.  Left Ear: External ear normal.  Nose: Mucosal edema present.  Mouth/Throat: Oropharynx is clear and moist.  Eyes: Conjunctivae and EOM are normal.  Neck: Neck supple.  Cardiovascular: Normal rate, regular rhythm and normal heart sounds. Exam reveals no gallop and no friction rub.  No murmur heard. Pulmonary/Chest: Effort normal and breath sounds normal. He has no wheezes. He has no rales.  Abdominal: Soft.  Lymphadenopathy:    He has no cervical adenopathy.  Neurological: He is alert and oriented to person, place, and time.  Skin: Skin is warm. No rash noted.  Psychiatric: He has a normal mood and affect. His behavior is normal.  Nursing note and vitals reviewed.  The plan was reviewed with the patient/family, and all questions/concerned were addressed.  It was my pleasure to see Dustin Rogers today and participate in his care. Please feel free to contact me with any questions or concerns.  Sincerely,  Wyline MoodYoon Dagan Heinz, DO Allergy & Immunology  [facility] [place] office: [phone] [facility] [phone]

## 2018-07-25 ENCOUNTER — Telehealth: Payer: Self-pay | Admitting: Allergy

## 2018-07-25 NOTE — Telephone Encounter (Signed)
Patient called back. Advised patient about medication or ENT referral. Patient would like to wait a couple more weeks to see if there is a change and will call back if need further direction.

## 2018-07-25 NOTE — Telephone Encounter (Signed)
Pt called and said that he is not sure if the meds are helping nedd to talk with a nurse. [facility]. [phone].

## 2018-07-25 NOTE — Telephone Encounter (Signed)
Called patient he wanted to know if he should continue to use Dustin Rogers sinus rinse and Dustin Rogers nasal spray. Advised patient they are diff and it is safe to use them together. Patient wants to know when will these nasal sprays start working for snoring states that the snoring has gotten worse since last seen states it has gotten so bad wife went to sleep in separate room last night. Dr Selena BattenKim please advise

## 2018-07-25 NOTE — Telephone Encounter (Signed)
Called and left a voicemail asking for the patient to call back to discuss.  

## 2018-07-25 NOTE — Telephone Encounter (Signed)
Please call back patient. It usually takes 2-4 weeks to see the full effect of the nasal spray. If interested, we can also place referral to ENT now.

## 2018-08-01 NOTE — Telephone Encounter (Signed)
Patient would like to go ahead and see an ENT.   Please Advise

## 2018-08-01 NOTE — Telephone Encounter (Signed)
Can you please refer patient per Dr Selena BattenKim

## 2018-08-01 NOTE — Telephone Encounter (Signed)
Please place referral to ENT for chronic rhinitis and snoring. Thank you.

## 2018-08-03 NOTE — Telephone Encounter (Signed)
Referral Placed into [facility] with Dr Luther Hearingeohs office.

## 2018-09-13 NOTE — Telephone Encounter (Signed)
Patient is down for 09/21/2018

## 2018-09-19 ENCOUNTER — Telehealth: Payer: Self-pay | Admitting: Allergy

## 2018-09-19 NOTE — Telephone Encounter (Signed)
Pt called and said that we had scheduled him with dr ,Luther Hearing and thet are out of network and he needs a dr in [phone].

## 2018-09-19 NOTE — Telephone Encounter (Signed)
Spoke with patient and he states he confirmed with insurance and Dr. Suszanne Conners is in network.

## 2018-09-21 DIAGNOSIS — J343 Hypertrophy of nasal turbinates: Secondary | ICD-10-CM | POA: Diagnosis not present

## 2018-09-21 DIAGNOSIS — J310 Chronic rhinitis: Secondary | ICD-10-CM | POA: Diagnosis not present

## 2018-09-21 DIAGNOSIS — J342 Deviated nasal septum: Secondary | ICD-10-CM | POA: Diagnosis not present

## 2018-10-04 DIAGNOSIS — I10 Essential (primary) hypertension: Secondary | ICD-10-CM | POA: Diagnosis not present

## 2018-10-04 DIAGNOSIS — E119 Type 2 diabetes mellitus without complications: Secondary | ICD-10-CM | POA: Diagnosis not present

## 2018-10-04 DIAGNOSIS — N529 Male erectile dysfunction, unspecified: Secondary | ICD-10-CM | POA: Diagnosis not present

## 2018-10-28 DIAGNOSIS — J343 Hypertrophy of nasal turbinates: Secondary | ICD-10-CM | POA: Diagnosis not present

## 2018-10-28 DIAGNOSIS — J342 Deviated nasal septum: Secondary | ICD-10-CM | POA: Diagnosis not present

## 2018-10-28 DIAGNOSIS — J3489 Other specified disorders of nose and nasal sinuses: Secondary | ICD-10-CM | POA: Diagnosis not present

## 2018-11-03 DIAGNOSIS — Z20828 Contact with and (suspected) exposure to other viral communicable diseases: Secondary | ICD-10-CM | POA: Diagnosis not present

## 2018-11-03 DIAGNOSIS — J111 Influenza due to unidentified influenza virus with other respiratory manifestations: Secondary | ICD-10-CM | POA: Diagnosis not present

## 2018-12-29 ENCOUNTER — Telehealth: Payer: Self-pay | Admitting: Allergy

## 2018-12-29 MED ORDER — GUAIFENESIN ER 600 MG PO TB12: ORAL_TABLET | ORAL | 0 refills | Status: DC

## 2018-12-29 NOTE — Telephone Encounter (Signed)
I know that patient can take mucinex to help loosen things up but do you have any other recommendations?

## 2018-12-29 NOTE — Telephone Encounter (Signed)
Patient denies fevers/chills/breathing changes. He wants to know if you can prescribe something to help with the mucus as the Mucinex gets pricey being OTC.

## 2018-12-29 NOTE — Telephone Encounter (Signed)
I sent in mucinex 600mg  tablet. It says preferred level 2 but all these medications are OTC so not sure if insurance will pay for it.

## 2018-12-29 NOTE — Telephone Encounter (Signed)
Patient informed and will let us know if anything is needed.

## 2018-12-29 NOTE — Telephone Encounter (Signed)
May take mucinex 600-1200mg  twice a day. Take it with plenty of water.   Is he still taking the Flonase and Singulair? Any fevers or chills? Trouble breathing?

## 2018-12-29 NOTE — Telephone Encounter (Signed)
Patient is wonting know if there is a script he can be given or if there is an OTC med that he can take to break up mucus in his chest and throat Patient is stating he is having to cough it out Please call

## 2019-01-03 DIAGNOSIS — H04123 Dry eye syndrome of bilateral lacrimal glands: Secondary | ICD-10-CM | POA: Diagnosis not present

## 2019-01-03 DIAGNOSIS — H2513 Age-related nuclear cataract, bilateral: Secondary | ICD-10-CM | POA: Diagnosis not present

## 2019-01-03 DIAGNOSIS — R7309 Other abnormal glucose: Secondary | ICD-10-CM | POA: Diagnosis not present

## 2019-01-03 DIAGNOSIS — H25013 Cortical age-related cataract, bilateral: Secondary | ICD-10-CM | POA: Diagnosis not present

## 2019-01-09 MED ORDER — GUAIFENESIN ER 600 MG PO TB12: ORAL_TABLET | ORAL | 0 refills | Status: AC

## 2019-02-06 ENCOUNTER — Other Ambulatory Visit: Payer: Self-pay | Admitting: *Deleted

## 2019-02-06 MED ORDER — MONTELUKAST SODIUM 10 MG PO TABS: 10.0000 mg | ORAL_TABLET | Freq: Every day | ORAL | 0 refills | Status: DC

## 2019-02-06 NOTE — Telephone Encounter (Signed)
Courtesy refill  

## 2019-02-28 ENCOUNTER — Other Ambulatory Visit: Payer: Self-pay | Admitting: Allergy

## 2019-02-28 NOTE — Telephone Encounter (Signed)
Courtesy refill already given. Denied montelukast.

## 2019-03-07 ENCOUNTER — Other Ambulatory Visit: Payer: Self-pay | Admitting: *Deleted

## 2019-03-07 MED ORDER — MONTELUKAST SODIUM 10 MG PO TABS: 10.0000 mg | ORAL_TABLET | Freq: Every day | ORAL | 0 refills | Status: AC

## 2019-03-07 NOTE — Telephone Encounter (Signed)
Courtesy refill sent in  

## 2019-04-03 ENCOUNTER — Other Ambulatory Visit: Payer: Self-pay | Admitting: Allergy

## 2019-04-04 DIAGNOSIS — Z0000 Encounter for general adult medical examination without abnormal findings: Secondary | ICD-10-CM | POA: Diagnosis not present

## 2019-04-04 DIAGNOSIS — J309 Allergic rhinitis, unspecified: Secondary | ICD-10-CM | POA: Diagnosis not present

## 2019-04-04 DIAGNOSIS — I10 Essential (primary) hypertension: Secondary | ICD-10-CM | POA: Diagnosis not present

## 2019-04-04 DIAGNOSIS — E119 Type 2 diabetes mellitus without complications: Secondary | ICD-10-CM | POA: Diagnosis not present

## 2019-05-01 DIAGNOSIS — M23252 Derangement of posterior horn of lateral meniscus due to old tear or injury, left knee: Secondary | ICD-10-CM | POA: Diagnosis not present

## 2019-06-06 DIAGNOSIS — J310 Chronic rhinitis: Secondary | ICD-10-CM | POA: Diagnosis not present

## 2019-06-06 DIAGNOSIS — J343 Hypertrophy of nasal turbinates: Secondary | ICD-10-CM | POA: Diagnosis not present

## 2019-07-10 DIAGNOSIS — Z125 Encounter for screening for malignant neoplasm of prostate: Secondary | ICD-10-CM | POA: Diagnosis not present

## 2019-07-10 DIAGNOSIS — N401 Enlarged prostate with lower urinary tract symptoms: Secondary | ICD-10-CM | POA: Diagnosis not present

## 2019-07-10 DIAGNOSIS — R3912 Poor urinary stream: Secondary | ICD-10-CM | POA: Diagnosis not present

## 2019-07-10 DIAGNOSIS — N5201 Erectile dysfunction due to arterial insufficiency: Secondary | ICD-10-CM | POA: Diagnosis not present

## 2019-10-06 DIAGNOSIS — N529 Male erectile dysfunction, unspecified: Secondary | ICD-10-CM | POA: Diagnosis not present

## 2019-10-06 DIAGNOSIS — E119 Type 2 diabetes mellitus without complications: Secondary | ICD-10-CM | POA: Diagnosis not present

## 2019-10-06 DIAGNOSIS — I10 Essential (primary) hypertension: Secondary | ICD-10-CM | POA: Diagnosis not present

## 2019-10-06 DIAGNOSIS — J309 Allergic rhinitis, unspecified: Secondary | ICD-10-CM | POA: Diagnosis not present

## 2019-11-23 ENCOUNTER — Ambulatory Visit: Payer: Self-pay

## 2019-11-28 DIAGNOSIS — Z20828 Contact with and (suspected) exposure to other viral communicable diseases: Secondary | ICD-10-CM | POA: Diagnosis not present

## 2019-11-29 ENCOUNTER — Ambulatory Visit: Payer: Self-pay

## 2019-12-07 ENCOUNTER — Ambulatory Visit: Payer: Self-pay | Attending: Internal Medicine

## 2019-12-07 DIAGNOSIS — Z23 Encounter for immunization: Secondary | ICD-10-CM

## 2019-12-07 NOTE — Progress Notes (Signed)
   [facility] Vaccination [facility]  Name:  JAYLON BOYLEN    MRN: [id number] DOB: [birth year]  12/07/2019  Mr. Sheehan was observed post Covid-19 immunization for 15 minutes without incident. He was provided with Vaccine Information Sheet and instruction to access the V-Safe system.   Mr. Bebo was instructed to call 911 with any severe reactions post vaccine: Marland Kitchen Difficulty breathing  . Swelling of face and throat  . A fast heartbeat  . A bad rash all over body  . Dizziness and weakness   Immunizations Administered    Name Date Dose VIS Date Route   Pfizer COVID-19 Vaccine 12/07/2019 10:00 AM 0.3 mL 08/18/2019 Intramuscular   Manufacturer: [employer], [employer]   Lot: [id number]   NDC: [address]-8

## 2019-12-14 DIAGNOSIS — R3912 Poor urinary stream: Secondary | ICD-10-CM | POA: Diagnosis not present

## 2019-12-14 DIAGNOSIS — N5201 Erectile dysfunction due to arterial insufficiency: Secondary | ICD-10-CM | POA: Diagnosis not present

## 2019-12-14 DIAGNOSIS — N401 Enlarged prostate with lower urinary tract symptoms: Secondary | ICD-10-CM | POA: Diagnosis not present

## 2019-12-18 ENCOUNTER — Ambulatory Visit: Payer: Self-pay

## 2020-01-01 ENCOUNTER — Ambulatory Visit: Payer: Self-pay | Attending: Internal Medicine

## 2020-01-01 DIAGNOSIS — Z23 Encounter for immunization: Secondary | ICD-10-CM

## 2020-01-01 NOTE — Progress Notes (Signed)
   Covid-[facility] Vaccination Clinic  Name:  Dustin Rogers    MRN: [id number] DOB: 1966/04/21  01/01/2020  [name] was observed post Covid-19 immunization for 15 minutes without incident. He was provided with Vaccine Information Sheet and instruction to access the V-Safe system.   [name] was instructed to call 911 with any severe reactions post vaccine: Marland Kitchen Difficulty breathing  . Swelling of face and throat  . A fast heartbeat  . A bad rash all over body  . Dizziness and weakness   Immunizations Administered    Name Date Dose VIS Date Route   Pfizer COVID-19 Vaccine 01/01/2020  8:56 AM 0.3 mL 11/01/2018 Intramuscular   Manufacturer: [employer], [employer]   Lot: [id number]   NDC: [address]-9

## 2020-01-04 DIAGNOSIS — H524 Presbyopia: Secondary | ICD-10-CM | POA: Diagnosis not present

## 2020-01-04 DIAGNOSIS — H35362 Drusen (degenerative) of macula, left eye: Secondary | ICD-10-CM | POA: Diagnosis not present

## 2020-01-04 DIAGNOSIS — H25013 Cortical age-related cataract, bilateral: Secondary | ICD-10-CM | POA: Diagnosis not present

## 2020-01-04 DIAGNOSIS — H2513 Age-related nuclear cataract, bilateral: Secondary | ICD-10-CM | POA: Diagnosis not present

## 2020-01-04 DIAGNOSIS — H04123 Dry eye syndrome of bilateral lacrimal glands: Secondary | ICD-10-CM | POA: Diagnosis not present

## 2020-04-08 DIAGNOSIS — Z0000 Encounter for general adult medical examination without abnormal findings: Secondary | ICD-10-CM | POA: Diagnosis not present

## 2020-04-08 DIAGNOSIS — Z23 Encounter for immunization: Secondary | ICD-10-CM | POA: Diagnosis not present

## 2020-04-08 DIAGNOSIS — Z13220 Encounter for screening for lipoid disorders: Secondary | ICD-10-CM | POA: Diagnosis not present

## 2020-04-08 DIAGNOSIS — E119 Type 2 diabetes mellitus without complications: Secondary | ICD-10-CM | POA: Diagnosis not present

## 2020-04-08 DIAGNOSIS — I10 Essential (primary) hypertension: Secondary | ICD-10-CM | POA: Diagnosis not present

## 2020-04-08 DIAGNOSIS — M25551 Pain in right hip: Secondary | ICD-10-CM | POA: Diagnosis not present

## 2020-07-15 DIAGNOSIS — R3912 Poor urinary stream: Secondary | ICD-10-CM | POA: Diagnosis not present

## 2020-07-15 DIAGNOSIS — N401 Enlarged prostate with lower urinary tract symptoms: Secondary | ICD-10-CM | POA: Diagnosis not present

## 2020-07-25 DIAGNOSIS — Z125 Encounter for screening for malignant neoplasm of prostate: Secondary | ICD-10-CM | POA: Diagnosis not present

## 2020-07-25 DIAGNOSIS — R3912 Poor urinary stream: Secondary | ICD-10-CM | POA: Diagnosis not present

## 2020-07-25 DIAGNOSIS — N5201 Erectile dysfunction due to arterial insufficiency: Secondary | ICD-10-CM | POA: Diagnosis not present

## 2020-07-25 DIAGNOSIS — N401 Enlarged prostate with lower urinary tract symptoms: Secondary | ICD-10-CM | POA: Diagnosis not present

## 2021-10-08 NOTE — Telephone Encounter (Signed)
error 

## 2024-03-24 ENCOUNTER — Encounter: Payer: Self-pay | Admitting: Advanced Practice Midwife
